# Patient Record
Sex: Female | Born: 1975 | Race: White | Hispanic: No | Marital: Single | Smoking: Former smoker
Health system: Southern US, Community
[De-identification: ages and names within clinical notes are randomized; demographics above are authoritative.]

---

## 2013-09-26 ENCOUNTER — Ambulatory Visit: Payer: Self-pay | Admitting: Physician Assistant

## 2013-09-26 LAB — URINALYSIS, COMPLETE
Bilirubin,UR: NEGATIVE
Glucose,UR: NEGATIVE mg/dL (ref 0–75)
Ketone: NEGATIVE
NITRITE: NEGATIVE
PH: 7.5 (ref 4.5–8.0)
Protein: 100
Specific Gravity: 1.01 (ref 1.003–1.030)

## 2013-09-28 LAB — URINE CULTURE

## 2014-02-03 ENCOUNTER — Ambulatory Visit: Payer: Self-pay | Admitting: Nurse Practitioner

## 2015-07-23 IMAGING — US US BREAST*L* LIMITED INC AXILLA
1 series · 4 of 4 positions shown · non-contrast
Comparison: Baseline exam

CLINICAL DATA: 38-year-old female with palpable left breast
finding.

EXAM:
DIGITAL DIAGNOSTIC  BILATERAL MAMMOGRAM WITH CAD
ULTRASOUND LEFT BREAST

[Series 1: us breast*left* limited inc axilla · 0.09mm/px · 4 of 4 slices shown]
[im 1/4]
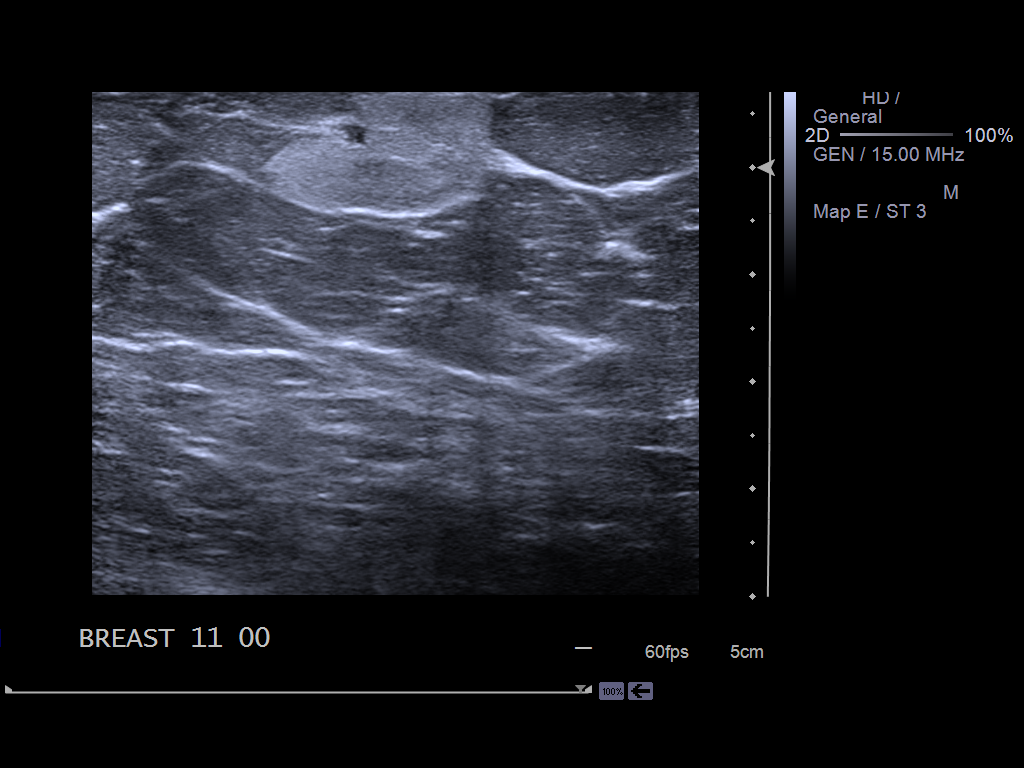
[im 2/4]
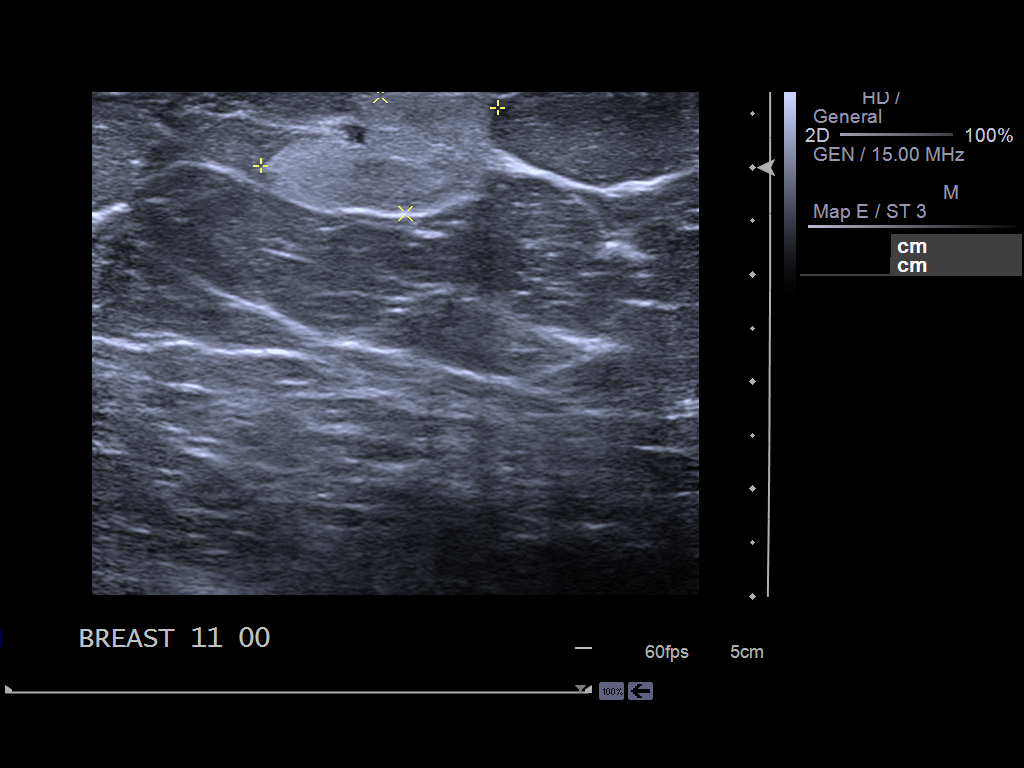
[im 3/4]
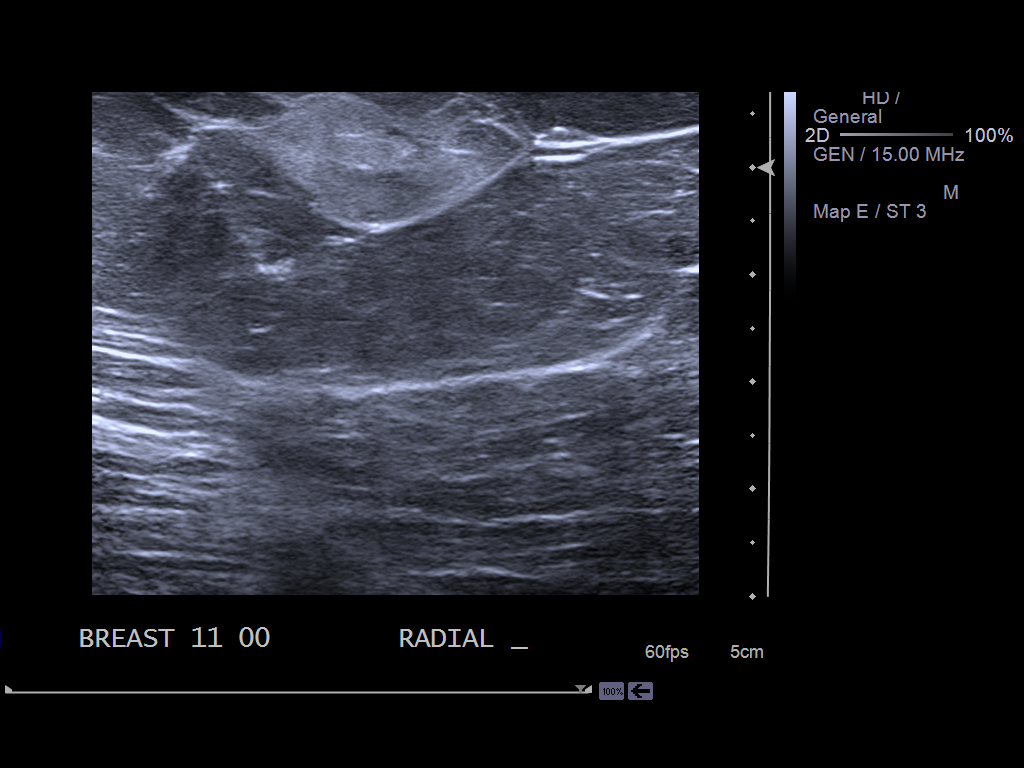
[im 4/4]
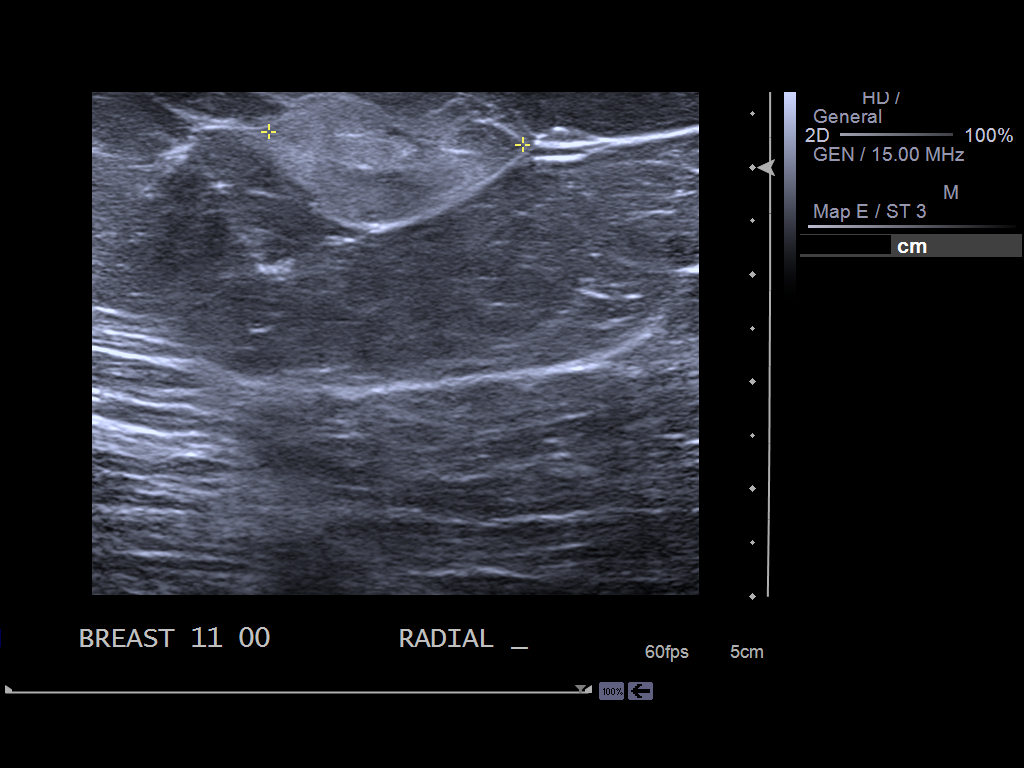

[4 of 4 positions shown; findings below may reference images not displayed]

ACR Breast Density Category b: There are scattered areas of
fibroglandular density.
FINDINGS: A mixed density focal asymmetry is identified within the upper,
inner left breast corresponding to the site of palpable marker. No
finding suggestive of malignancy is identified within either breast.

Mammographic images were processed with CAD.

On physical exam, the patient has an area of bruising near the site
of palpable abnormality within the left breast. A firm 2-3 cm mass
is identified within the left breast at 11 o'clock, 13 cm from the
nipple.

Targeted ultrasound was performed demonstrating a mixed
echogenicity, predominantly hyperechoic oval mass at 11 o'clock, 13
cm from the nipple measuring 2.4 x 2.3 x 1.1 cm. This finding most
likely represents fat necrosis.
IMPRESSION: Probably benign left breast finding.

RECOMMENDATION:
Follow-up left breast ultrasound and possible mammogram in 2 months.

I have discussed the findings and recommendations with the patient.
Results were also provided in writing at the conclusion of the
visit. If applicable, a reminder letter will be sent to the patient
regarding the next appointment.

BI-RADS CATEGORY  3: Probably benign.

## 2016-01-07 ENCOUNTER — Ambulatory Visit
Admission: EM | Admit: 2016-01-07 | Discharge: 2016-01-07 | Disposition: A | Discharge status: Home / Self Care | Payer: Self-pay | Attending: Family Medicine | Admitting: Family Medicine

## 2016-01-07 DIAGNOSIS — J069 Acute upper respiratory infection, unspecified: Secondary | ICD-10-CM

## 2016-01-07 MED ORDER — ALBUTEROL SULFATE HFA 108 (90 BASE) MCG/ACT IN AERS: 1.0000 | INHALATION_SPRAY | Freq: Four times a day (QID) | RESPIRATORY_TRACT | 0 refills | Status: DC | PRN

## 2016-01-07 MED ORDER — AZITHROMYCIN 250 MG PO TABS: ORAL_TABLET | ORAL | 0 refills | Status: DC

## 2016-01-07 NOTE — ED Triage Notes (Signed)
Patient complains of chest congestion, she started out with cold like symptoms (cough, runny nose) and over night it has settled in her chest and making it hard to breath.

## 2016-01-07 NOTE — ED Provider Notes (Signed)
[facility]    CSN: [id number] Arrival date & time: 01/07/16  7829  First Provider Contact:  None       History   Chief Complaint Chief Complaint  Patient presents with  . Nasal Congestion    Chest    HPI Mackenzie Griffin is a 40 y.o. female.   HPI: Patient presents today with symptoms of nasal congestion, chest congestion. Patient states that she's had the symptoms for the last 4-5 days. She states that initially she felt like she had a fever but no longer. She has had some wheezing. She denies any history of asthma or COPD. She does smoke. She denies any lower extremity edema or pain. She has been trying Mucinex for her symptoms.  History reviewed. No pertinent past medical history.  There are no active problems to display for this patient.   History reviewed. No pertinent surgical history.  OB History    Gravida Para Term Preterm AB Living   1             SAB TAB Ectopic Multiple Live Births                   Home Medications    Prior to Admission medications   Medication Sig Start Date End Date Taking? Authorizing Provider  albuterol (PROVENTIL HFA;VENTOLIN HFA) 108 (90 Base) MCG/ACT inhaler Inhale 1-2 puffs into the lungs every 6 (six) hours as needed for wheezing or shortness of breath. 01/07/16   Jolene Provost, MD  azithromycin (ZITHROMAX Z-PAK) 250 MG tablet Use as directed for 5 days on box. 01/07/16   Jolene Provost, MD    Family History Family History  Problem Relation Age of Onset  . Heart failure Father     Social History Social History  Substance Use Topics  . Smoking status: Current Every Day Smoker    Packs/day: 1.00    Types: Cigarettes  . Smokeless tobacco: Never Used  . Alcohol use No     Allergies   Review of patient's allergies indicates no known allergies.   Review of Systems Review of Systems: Negative except mentioned above.   Physical Exam Triage Vital Signs ED Triage Vitals  Enc Vitals Group     BP 01/07/16  0957 134/83     Pulse Rate 01/07/16 0957 95     Resp 01/07/16 0957 20     Temp 01/07/16 0957 97.9 F (36.6 C)     Temp Source 01/07/16 0957 Oral     SpO2 01/07/16 0957 96 %     Weight 01/07/16 0957 274 lb 9.6 oz (124.6 kg)     Height 01/07/16 0957 5\' 6"  (1.676 m)     Head Circumference --      Peak Flow --      Pain Score 01/07/16 0959 2     Pain Loc --      Pain Edu? --      Excl. in GC? --    No data found.   Updated Vital Signs BP 134/83 (BP Location: Right Arm)   Pulse 95   Temp 97.9 F (36.6 C) (Oral)   Resp 20   Ht 5\' 6"  (1.676 m)   Wt 274 lb 9.6 oz (124.6 kg)   LMP 12/15/2015   SpO2 96%   BMI 44.32 kg/m      Physical Exam:  GENERAL: NAD HEENT: mild pharyngeal erythema, no exudate, no erythema of TMs, no cervical LAD RESP: Mild expiratory wheezing, no  tachypnea or accessory muscle use CARD: RRR NEURO: CN II-XII grossly intact    UC Treatments / Results  Labs (all labs ordered are listed, but only abnormal results are displayed) Labs Reviewed - No data to display  EKG  EKG Interpretation None       Radiology No results found.  Procedures Procedures (including critical care time)  Medications Ordered in UC Medications - No data to display   Initial Impression / Assessment and Plan / UC Course  I have reviewed the triage vital signs and the nursing notes.  Pertinent labs & imaging results that were available during my care of the patient were reviewed by me and considered in my medical decision making (see chart for details).  Clinical Course   A/P: URI, Bronchitis - will treat with Z-Pak, albuterol inhaler when necessary, Delsym when necessary, rest, hydration, work excuse given to patient for today, I have advised the patient that if her symptoms do persist or worsen that she seek medical attention. Encourage patient on smoking cessation. Final Clinical Impressions(s) / UC Diagnoses   Final diagnoses:  URI, acute    New  Prescriptions New Prescriptions   ALBUTEROL (PROVENTIL HFA;VENTOLIN HFA) 108 (90 BASE) MCG/ACT INHALER    Inhale 1-2 puffs into the lungs every 6 (six) hours as needed for wheezing or shortness of breath.   AZITHROMYCIN (ZITHROMAX Z-PAK) 250 MG TABLET    Use as directed for 5 days on box.      Jolene Provost, MD 01/07/16 1023

## 2020-12-06 ENCOUNTER — Other Ambulatory Visit: Payer: Self-pay

## 2020-12-06 ENCOUNTER — Emergency Department: Payer: Self-pay

## 2020-12-06 ENCOUNTER — Inpatient Hospital Stay
Admission: EM | Admit: 2020-12-06 | Discharge: 2020-12-10 | DRG: 059 | Disposition: A | Discharge status: Home / Self Care | Payer: Managed Care, Other (non HMO) | Attending: Internal Medicine | Admitting: Internal Medicine

## 2020-12-06 DIAGNOSIS — Z6841 Body Mass Index (BMI) 40.0 and over, adult: Secondary | ICD-10-CM

## 2020-12-06 DIAGNOSIS — R509 Fever, unspecified: Secondary | ICD-10-CM | POA: Diagnosis present

## 2020-12-06 DIAGNOSIS — Z8249 Family history of ischemic heart disease and other diseases of the circulatory system: Secondary | ICD-10-CM

## 2020-12-06 DIAGNOSIS — F17210 Nicotine dependence, cigarettes, uncomplicated: Secondary | ICD-10-CM | POA: Diagnosis present

## 2020-12-06 DIAGNOSIS — E538 Deficiency of other specified B group vitamins: Secondary | ICD-10-CM | POA: Diagnosis present

## 2020-12-06 DIAGNOSIS — Z20822 Contact with and (suspected) exposure to covid-19: Secondary | ICD-10-CM | POA: Diagnosis present

## 2020-12-06 DIAGNOSIS — M2578 Osteophyte, vertebrae: Secondary | ICD-10-CM | POA: Diagnosis present

## 2020-12-06 DIAGNOSIS — T380X5A Adverse effect of glucocorticoids and synthetic analogues, initial encounter: Secondary | ICD-10-CM | POA: Diagnosis present

## 2020-12-06 DIAGNOSIS — H5461 Unqualified visual loss, right eye, normal vision left eye: Secondary | ICD-10-CM | POA: Diagnosis present

## 2020-12-06 DIAGNOSIS — G35 Multiple sclerosis: Principal | ICD-10-CM | POA: Diagnosis present

## 2020-12-06 DIAGNOSIS — E559 Vitamin D deficiency, unspecified: Secondary | ICD-10-CM | POA: Diagnosis present

## 2020-12-06 DIAGNOSIS — H469 Unspecified optic neuritis: Secondary | ICD-10-CM

## 2020-12-06 DIAGNOSIS — E669 Obesity, unspecified: Secondary | ICD-10-CM | POA: Diagnosis present

## 2020-12-06 DIAGNOSIS — F17200 Nicotine dependence, unspecified, uncomplicated: Secondary | ICD-10-CM | POA: Diagnosis present

## 2020-12-06 LAB — RESP PANEL BY RT-PCR (FLU A&B, COVID) ARPGX2
Influenza A by PCR: NEGATIVE
Influenza B by PCR: NEGATIVE
SARS Coronavirus 2 by RT PCR: NEGATIVE

## 2020-12-06 LAB — CBC
HCT: 43.1 % (ref 36.0–46.0)
Hemoglobin: 14.7 g/dL (ref 12.0–15.0)
MCH: 30.9 pg (ref 26.0–34.0)
MCHC: 34.1 g/dL (ref 30.0–36.0)
MCV: 90.5 fL (ref 80.0–100.0)
Platelets: 360 10*3/uL (ref 150–400)
RBC: 4.76 MIL/uL (ref 3.87–5.11)
RDW: 12.3 % (ref 11.5–15.5)
WBC: 9.8 10*3/uL (ref 4.0–10.5)
nRBC: 0 % (ref 0.0–0.2)

## 2020-12-06 LAB — BASIC METABOLIC PANEL
Anion gap: 8 (ref 5–15)
BUN: 7 mg/dL (ref 6–20)
CO2: 23 mmol/L (ref 22–32)
Calcium: 8.5 mg/dL — ABNORMAL LOW (ref 8.9–10.3)
Chloride: 107 mmol/L (ref 98–111)
Creatinine, Ser: 0.75 mg/dL (ref 0.44–1.00)
GFR, Estimated: >60 mL/min (ref >60)
Glucose, Bld: 141 mg/dL — ABNORMAL HIGH (ref 70–99)
Potassium: 3.6 mmol/L (ref 3.5–5.1)
Sodium: 138 mmol/L (ref 135–145)

## 2020-12-06 MED ORDER — ACETAMINOPHEN 325 MG PO TABS
650.0000 mg | ORAL_TABLET | Freq: Four times a day (QID) | ORAL | Status: DC | PRN
  Administered 2020-12-07 – 2020-12-09 (×3): 650 mg via ORAL
  Filled 2020-12-06 (×3): qty 2

## 2020-12-06 MED ORDER — ENOXAPARIN SODIUM 80 MG/0.8ML IJ SOSY
65.0000 mg | PREFILLED_SYRINGE | INTRAMUSCULAR | Status: DC
  Administered 2020-12-06 – 2020-12-09 (×4): 65 mg via SUBCUTANEOUS
  Filled 2020-12-06 (×5): qty 0.65

## 2020-12-06 MED ORDER — ACETAMINOPHEN 650 MG RE SUPP: 650.0000 mg | Freq: Four times a day (QID) | RECTAL | Status: DC | PRN

## 2020-12-06 MED ORDER — ONDANSETRON HCL 4 MG/2ML IJ SOLN: 4.0000 mg | Freq: Four times a day (QID) | INTRAMUSCULAR | Status: DC | PRN

## 2020-12-06 MED ORDER — SODIUM CHLORIDE 0.9 % IV SOLN: 250.0000 mL | INTRAVENOUS | Status: DC | PRN

## 2020-12-06 MED ORDER — SODIUM CHLORIDE 0.9% FLUSH: 3.0000 mL | INTRAVENOUS | Status: DC | PRN

## 2020-12-06 MED ORDER — ONDANSETRON HCL 4 MG PO TABS: 4.0000 mg | ORAL_TABLET | Freq: Four times a day (QID) | ORAL | Status: DC | PRN

## 2020-12-06 MED ORDER — SODIUM CHLORIDE 0.9 % IV SOLN
1000.0000 mg | Freq: Every day | INTRAVENOUS | Status: AC
  Administered 2020-12-06 – 2020-12-10 (×5): 1000 mg via INTRAVENOUS
  Filled 2020-12-06 (×5): qty 8

## 2020-12-06 MED ORDER — SODIUM CHLORIDE 0.9% FLUSH
3.0000 mL | Freq: Two times a day (BID) | INTRAVENOUS | Status: DC
  Administered 2020-12-06 – 2020-12-10 (×8): 3 mL via INTRAVENOUS

## 2020-12-06 MED ORDER — NICOTINE 21 MG/24HR TD PT24
21.0000 mg | MEDICATED_PATCH | Freq: Every day | TRANSDERMAL | Status: DC
  Administered 2020-12-06 – 2020-12-10 (×5): 21 mg via TRANSDERMAL
  Filled 2020-12-06 (×5): qty 1

## 2020-12-06 NOTE — H&P (Addendum)
History and Physical    Mackenzie Griffin:[id number] DOB: 1976/04/15 DOA: 12/06/2020  PCP: Pcp, No   Patient coming from: Home  I have personally briefly reviewed patient's old medical records in [facility]  Chief Complaint: Loss of vision in the right eye  HPI: Mackenzie Griffin is a 45 y.o. female with medical history significant for morbid obesity, nicotine dependence who presents to the emergency room at the request of her ophthalmologist for evaluation of visual loss involving the right eye as well as pain in her right eye with movement. Patient states she has had symptoms for about 10 days, she describes haziness in her right eye which has been persistent for over a week associated with pain with any form of eye movement.  She initially thought her symptoms will resolve but when it did not she made an appointment to see an ophthalmologist who ordered an MRI.  MRI of the brain was consistent with optic neuritis and hyperintensities concerning for a demyelinating process and so patient was sent to the emergency room for neurology consult. She has no focal deficits, no numbness, no tingling, no difficulty swallowing, no urinary or fecal incontinence, no chest pain, no shortness of breath, no dizziness, no lightheadedness, no fever, no chills, no cough, no headache. Labs show sodium 138, potassium 3.6, chloride 107, bicarb 23, glucose 141, BUN 7, creatinine 0.75, calcium 8.5, white count 9.8, hemoglobin 14.7, hematocrit 43.1, MCV 90.5, RDW 12.3, platelet count 360.   ED Course: Patient is a 45 year old female who was referred to the emergency room by her ophthalmologist for a neurology consult due to findings on MRI suggestive of optic neuritis involving the right eye and hyperintensities consistent with a demyelinating process. Patient gives a 10-day history of decreased visual acuity in her right eye associated with pain in her right eye with any form of eye movement. Neurology recommends  Solu-Medrol 1 g IV daily for 5 days. Patient will be admitted to the hospital for further evaluation.    Review of Systems: As per HPI otherwise all other systems reviewed and negative.    History reviewed. No pertinent past medical history.  History reviewed. No pertinent surgical history.   reports that she has been smoking cigarettes. She has been smoking an average of 1.00 packs per day. She has never used smokeless tobacco. She reports that she does not drink alcohol and does not use drugs.  No Known Allergies  Family History  Problem Relation Age of Onset   Coronary artery disease Father       Prior to Admission medications   Not on File    Physical Exam: Vitals:   12/06/20 1201 12/06/20 1205 12/06/20 1325  BP:  (!) 152/89 (!) 147/93  Pulse:  (!) 113 (!) 101  Resp:  18 16  Temp:  98.3 F (36.8 C)   TempSrc:  Oral   SpO2:  96% 96%  Height: 5\' 6"  (1.676 m)       Vitals:   12/06/20 1201 12/06/20 1205 12/06/20 1325  BP:  (!) 152/89 (!) 147/93  Pulse:  (!) 113 (!) 101  Resp:  18 16  Temp:  98.3 F (36.8 C)   TempSrc:  Oral   SpO2:  96% 96%  Height: 5\' 6"  (1.676 m)        Constitutional: Alert and oriented x 3 . Not in any apparent distress.  Obese HEENT:      Head: Normocephalic and atraumatic.  Eyes: PERLA, EOMI, Conjunctivae are normal. Sclera is non-icteric.       Mouth/Throat: Mucous membranes are moist.       Neck: Supple with no signs of meningismus. Cardiovascular: Regular rate and rhythm. No murmurs, gallops, or rubs. 2+ symmetrical distal pulses are present . No JVD. No LE edema Respiratory: Respiratory effort normal .Lungs sounds clear bilaterally. No wheezes, crackles, or rhonchi.  Gastrointestinal: Soft, non tender, and non distended with positive bowel sounds.  Genitourinary: No CVA tenderness. Musculoskeletal: Nontender with normal range of motion in all extremities. No cyanosis, or erythema of extremities. Neurologic:  Face is  symmetric. Moving all extremities. No gross focal neurologic deficits . Skin: Skin is warm, dry.  No rash or ulcers Psychiatric: Mood and affect are normal    Labs on Admission: I have personally reviewed following labs and imaging studies  CBC: Recent Labs  Lab 12/06/20 1217  WBC 9.8  HGB 14.7  HCT 43.1  MCV 90.5  PLT 360   Basic Metabolic Panel: Recent Labs  Lab 12/06/20 1217  NA 138  K 3.6  CL 107  CO2 23  GLUCOSE 141*  BUN 7  CREATININE 0.75  CALCIUM 8.5*   GFR: CrCl cannot be calculated (Unknown ideal weight.). Liver Function Tests: No results for input(s): AST, ALT, ALKPHOS, BILITOT, PROT, ALBUMIN in the last 168 hours. No results for input(s): LIPASE, AMYLASE in the last 168 hours. No results for input(s): AMMONIA in the last 168 hours. Coagulation Profile: No results for input(s): INR, PROTIME in the last 168 hours. Cardiac Enzymes: No results for input(s): CKTOTAL, CKMB, CKMBINDEX, TROPONINI in the last 168 hours. BNP (last 3 results) No results for input(s): PROBNP in the last 8760 hours. HbA1C: No results for input(s): HGBA1C in the last 72 hours. CBG: No results for input(s): GLUCAP in the last 168 hours. Lipid Profile: No results for input(s): CHOL, HDL, LDLCALC, TRIG, CHOLHDL, LDLDIRECT in the last 72 hours. Thyroid Function Tests: No results for input(s): TSH, T4TOTAL, FREET4, T3FREE, THYROIDAB in the last 72 hours. Anemia Panel: No results for input(s): VITAMINB12, FOLATE, FERRITIN, TIBC, IRON, RETICCTPCT in the last 72 hours. Urine analysis:    Component Value Date/Time   COLORURINE YELLOW 09/26/2013 1007   APPEARANCEUR CLOUDY 09/26/2013 1007   LABSPEC 1.010 09/26/2013 1007   PHURINE 7.5 09/26/2013 1007   GLUCOSEU NEGATIVE 09/26/2013 1007   HGBUR 2+ 09/26/2013 1007   BILIRUBINUR NEGATIVE 09/26/2013 1007   KETONESUR NEGATIVE 09/26/2013 1007   PROTEINUR 100 mg/dL 51/70/0174 9449   NITRITE NEGATIVE 09/26/2013 1007   LEUKOCYTESUR 3+  09/26/2013 1007    Radiological Exams on Admission: No results found.   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 Patient presents for evaluation of decreased visual acuity and pain in her right eye for about 10 days and had an MRI which showed findings consistent with optic neuritis and hyperintensities suggestive of a demyelinating lesion. Appreciate neurology input Continue Solu-Medrol 1 g IV daily as initiated in the ER Further recommendation per neurology    Nicotine dependence Smoking cessation has been discussed with patient in detail Will place patient on a nicotine transdermal patch at this time    Obesity Complicates overall prognosis and care  DVT prophylaxis: Lovenox  Code Status: full code  Family Communication: Greater than 50% of time was spent discussing patient's condition  and plan of care with her and her mother at the bedside.  All questions and concerns have been addressed.  They verbalized understanding and agree with the plan. Disposition Plan: Back to previous home environment Consults called: Neurology Status: At the time of admission, it appears that the appropriate admission status for this patient is inpatient. This is judged to be reasonable and necessary in order to provide the required intensity of service to ensure the patient's safety given the presenting symptoms, physical exam findings, and initial radiographic and laboratory data in the context of their comorbid conditions. Patient requires inpatient status due to high intensity of service, high risk of further deterioration and high frequency of surveillance required.    Lucile Shutters MD [facility]     12/06/2020, 2:52 PM

## 2020-12-06 NOTE — ED Notes (Signed)
Visual acuity: LEFT eye:20/30, RIGHT 20/50

## 2020-12-06 NOTE — ED Notes (Signed)
Informed RN bed assigned 

## 2020-12-06 NOTE — ED Triage Notes (Signed)
Pt was sent from [facility] for a neuro consult, pt has loss of vision in the right eye with some pain for the past 10 days, has had a MRI performed that shows some lesions.

## 2020-12-06 NOTE — Progress Notes (Signed)
PHARMACIST - PHYSICIAN COMMUNICATION  CONCERNING:  Enoxaparin (Lovenox) for DVT Prophylaxis    RECOMMENDATION: Patient was prescribed enoxaprin 40mg  q24 hours for VTE prophylaxis.   Filed Weights   12/06/20 1615  Weight: 129.3 kg (285 lb 1.6 oz)    Body mass index is 46.02 kg/m.  Estimated Creatinine Clearance: 123.7 mL/min (by C-G formula based on SCr of 0.75 mg/dL).   Based on [facility] policy patient is candidate for enoxaparin 0.5mg /kg TBW SQ every 24 hours based on BMI being >30.   DESCRIPTION: Pharmacy has adjusted enoxaparin dose per [facility] policy.  Patient is now receiving enoxaparin 65 mg every 24 hours    [facility], PharmD, BCPS Clinical Pharmacist  12/06/2020 4:23 PM

## 2020-12-06 NOTE — Plan of Care (Addendum)
Neurology Plan of Care  Patient presented to ophtho for evaluation or R eye decreased visual acuity and R eye pain x10 days. MRI brain wwo was performed and c/w optic neuritis (OSH images uploaded and personally reviewed, contrast enhancement noted of the R optic nerve with multiple small bilateral L>R periventricular ovoid T2/FLAIR hyperintensities that do not enhance). Patient referred to ED for admission by ophtho.   D/w Dr. Roxan Hockey ED by phone. Recommend:  - Admission to hospital service for tx R optic neuritis - 1g solumedrol IV daily x5 days - MRI c spine wwo (ordered) - I will see patient in formal consultation tomorrow  Bing Neighbors, MD [facility] [phone]  If 7pm- 7am, please page neurology on call as listed in AMION.  Addendum: Pt would like to defer MRI c spine 2/2 cost concerns. Will cancel order for now and discuss with her in AM.

## 2020-12-06 NOTE — ED Provider Notes (Signed)
[facility] Emergency Department Provider Note    Event Date/Time   First MD Initiated Contact with Patient 12/06/20 1248     (approximate)  I have reviewed the triage vital signs and the nursing notes.   HISTORY  Chief Complaint Loss of Vision    HPI Mackenzie Griffin is a 45 y.o. female with no significant past medical history presents to the ER for roughly 10 days of right-sided eye discomfort as well as visual loss.  States that the right eyes become cloudy.  Is never had symptoms like this before.  She went to see her eye doctor had reassuring exam which point MRI was ordered.  She was sent to the ER as the MRI shows evidence of demyelinating process concern for MS.  No family history.  No new medications.  No recent illnesses or fever.  History reviewed. No pertinent past medical history. Family History  Problem Relation Age of Onset   Heart failure Father    History reviewed. No pertinent surgical history. There are no problems to display for this patient.     Prior to Admission medications   Not on File    Allergies Patient has no known allergies.    Social History Social History   Tobacco Use   Smoking status: Every Day    Packs/day: 1.00    Pack years: 0.00    Types: Cigarettes   Smokeless tobacco: Never  Substance Use Topics   Alcohol use: No    Review of Systems Patient denies headaches, rhinorrhea, blurry vision, numbness, shortness of breath, chest pain, edema, cough, abdominal pain, nausea, vomiting, diarrhea, dysuria, fevers, rashes or hallucinations unless otherwise stated above in HPI. ____________________________________________   PHYSICAL EXAM:  VITAL SIGNS: Vitals:   12/06/20 1205 12/06/20 1325  BP: (!) 152/89 (!) 147/93  Pulse: (!) 113 (!) 101  Resp: 18 16  Temp: 98.3 F (36.8 C)   SpO2: 96% 96%    Constitutional: Alert and oriented.  Eyes: Conjunctivae are normal.  Head: Atraumatic. Nose: No  congestion/rhinnorhea. Mouth/Throat: Mucous membranes are moist.   Neck: No stridor. Painless ROM.  Cardiovascular: Normal rate, regular rhythm. Grossly normal heart sounds.  Good peripheral circulation. Respiratory: Normal respiratory effort.  No retractions. Lungs CTAB. Gastrointestinal: Soft and nontender. No distention. No abdominal bruits. No CVA tenderness. Genitourinary:  Musculoskeletal: No lower extremity tenderness nor edema.  No joint effusions. Neurologic:  CN- intact.  No facial droop, Normal FNF.  Normal heel to shin.  Sensation intact bilaterally. Normal speech and language. No gross focal neurologic deficits are appreciated. No gait instability. Skin:  Skin is warm, dry and intact. No rash noted. Psychiatric: Mood and affect are normal. Speech and behavior are normal.  ____________________________________________   LABS (all labs ordered are listed, but only abnormal results are displayed)  Results for orders placed or performed during the hospital encounter of 12/06/20 (from the past 24 hour(s))  CBC     Status: None   Collection Time: 12/06/20 12:17 PM  Result Value Ref Range   WBC 9.8 4.0 - 10.5 K/uL   RBC 4.76 3.87 - 5.11 MIL/uL   Hemoglobin 14.7 12.0 - 15.0 g/dL   HCT 11.9 14.7 - 82.9 %   MCV 90.5 80.0 - 100.0 fL   MCH 30.9 26.0 - 34.0 pg   MCHC 34.1 30.0 - 36.0 g/dL   RDW 56.2 13.0 - 86.5 %   Platelets 360 150 - 400 K/uL   nRBC 0.0 0.0 -  0.2 %  Basic metabolic panel     Status: Abnormal   Collection Time: 12/06/20 12:17 PM  Result Value Ref Range   Sodium 138 135 - 145 mmol/L   Potassium 3.6 3.5 - 5.1 mmol/L   Chloride 107 98 - 111 mmol/L   CO2 23 22 - 32 mmol/L   Glucose, Bld 141 (H) 70 - 99 mg/dL   BUN 7 6 - 20 mg/dL   Creatinine, Ser 1.61 0.44 - 1.00 mg/dL   Calcium 8.5 (L) 8.9 - 10.3 mg/dL   GFR, Estimated >09 >60 mL/min   Anion gap 8 5 - 15    ____________________________________________  EKG____________________________________________  RADIOLOGY   ____________________________________________   PROCEDURES  Procedure(s) performed:  Procedures    Critical Care performed: no ____________________________________________   INITIAL IMPRESSION / ASSESSMENT AND PLAN / ED COURSE  Pertinent labs & imaging results that were available during my care of the patient were reviewed by me and considered in my medical decision making (see chart for details).   DDX: optic neuritis, ms, stroke, mass  Mackenzie Griffin is a 45 y.o. who presents to the ED with findings and presentation clinically concerning for new diagnosis of MS.  Discussed case in consultation with Dr. Selina Cooley of neurology who was recommended 1 g of Solu-Medrol daily and will order additional MR testings as well as is recommended admission to hospital for further evaluation management.  Patient agreeable to plan.  Will discuss with hospitalist for admission.     The patient was evaluated in Emergency Department today for the symptoms described in the history of present illness. He/she was evaluated in the context of the global COVID-19 pandemic, which necessitated consideration that the patient might be at risk for infection with the SARS-CoV-2 virus that causes COVID-19. Institutional protocols and algorithms that pertain to the evaluation of patients at risk for COVID-19 are in a state of rapid change based on information released by regulatory bodies including the CDC and federal and state organizations. These policies and algorithms were followed during the patient's care in the ED.  As part of my medical decision making, I reviewed the following data within the electronic MEDICAL RECORD NUMBER Nursing notes reviewed and incorporated, Labs reviewed, notes from prior ED visits and [place] Controlled Substance Database   ____________________________________________   FINAL  CLINICAL IMPRESSION(S) / ED DIAGNOSES  Final diagnoses:  Optic neuritis      NEW MEDICATIONS STARTED DURING THIS VISIT:  New Prescriptions   No medications on file     Note:  This document was prepared using Dragon voice recognition software and may include unintentional dictation errors.    Willy Eddy, MD 12/06/20 [phone]

## 2020-12-07 ENCOUNTER — Inpatient Hospital Stay: Payer: Managed Care, Other (non HMO)

## 2020-12-07 DIAGNOSIS — G35 Multiple sclerosis: Principal | ICD-10-CM

## 2020-12-07 DIAGNOSIS — H469 Unspecified optic neuritis: Secondary | ICD-10-CM

## 2020-12-07 LAB — HIV ANTIBODY (ROUTINE TESTING W REFLEX): HIV Screen 4th Generation wRfx: NONREACTIVE

## 2020-12-07 MED ORDER — LORAZEPAM 1 MG PO TABS
1.0000 mg | ORAL_TABLET | Freq: Once | ORAL | Status: AC | PRN
  Administered 2020-12-07: 1 mg via ORAL
  Filled 2020-12-07: qty 1

## 2020-12-07 MED ORDER — GADOBUTROL 1 MMOL/ML IV SOLN
10.0000 mL | Freq: Once | INTRAVENOUS | Status: AC | PRN
  Administered 2020-12-07: 18:00:00 10 mL via INTRAVENOUS

## 2020-12-07 NOTE — Consult Note (Signed)
NEUROLOGY CONSULTATION NOTE   Date of service: December 07, 2020 Patient Name: Mackenzie Griffin MRN:  [id number] DOB:  January 25, 1976 Reason for consult: R optic neuritis Requesting physician: Anastasio Auerbach MD _ _ _   _ __   _ __ _ _  __ __   _ __   __ _  History of Present Illness   45 yo woman with hx tobacco abuse admitted for treatment of R optic neuritis. Patient presented to ophtho for evaluation or R eye decreased visual acuity and R eye pain x10 days. MRI brain wwo was performed and c/w optic neuritis (OSH images uploaded and personally reviewed, contrast enhancement noted of the R optic nerve with multiple small bilateral L>R periventricular ovoid T2/FLAIR hyperintensities that do not enhance). Patient referred to ED for admission by ophtho and admitted for 5 day course of IV solumedrol.  Patient has no personal or family history of MS or neurologic disorders. She has no present focal neurologic deficits other than her R visual disturbance and pain on R EOM. She has never had a time in the past where she developed permanent or transient neurologic deficits such as focal weakness or numbness, other visual disturbance, balance trouble, speech or gait impairment.   She has received 2 rounds of 1g solumedrol since admission yesterday. No change in visual acuity since that time. No new neurologic complaints today.    ROS   Per HPI; all other systems reviewed and are negative  Past History   History reviewed. No pertinent past medical history. History reviewed. No pertinent surgical history. Family History  Problem Relation Age of Onset   Coronary artery disease Father    Social History   Socioeconomic History   Marital status: Single    Spouse name: Not on file   Number of children: Not on file   Years of education: Not on file   Highest education level: Not on file  Occupational History   Not on file  Tobacco Use   Smoking status: Every Day    Packs/day: 1.00    Pack years: 0.00     Types: Cigarettes   Smokeless tobacco: Never  Substance and Sexual Activity   Alcohol use: No   Drug use: Never   Sexual activity: Not on file  Other Topics Concern   Not on file  Social History Narrative   Not on file   Social Determinants of Health   Financial Resource Strain: Not on file  Food Insecurity: Not on file  Transportation Needs: Not on file  Physical Activity: Not on file  Stress: Not on file  Social Connections: Not on file   No Known Allergies  Medications   No medications prior to admission.     Vitals   Vitals:   12/06/20 2345 12/07/20 0505 12/07/20 0807 12/07/20 1322  BP: (!) 147/83 138/82 124/88 140/87  Pulse: 82 83 83 98  Resp: 17 18 18 18   Temp: 97.7 F (36.5 C) 97.9 F (36.6 C) 98.1 F (36.7 C) 97.7 F (36.5 C)  TempSrc:    Oral  SpO2: 97% 100% 100% 100%  Weight:      Height:         Body mass index is 46.02 kg/m.  Physical Exam   Physical Exam Gen: A&O x4, NAD HEENT: Atraumatic, normocephalic;mucous membranes moist; oropharynx clear, tongue without atrophy or fasciculations. Neck: Supple, trachea midline. Resp: CTAB, no w/r/r CV: RRR, no m/g/r; nml S1 and S2. 2+ symmetric peripheral pulses. Abd:  soft/NT/ND; nabs x 4 quad Extrem: Nml bulk; no cyanosis, clubbing, or edema.  Neuro: *MS: A&O x4. Follows multi-step commands.  *Speech: fluid, nondysarthric, able to name and repeat *CN:    I: Deferred   II,III: PERRLA, R APD, VFF by confrontation   III,IV,VI: EOMI w/o nystagmus, no ptosis   V: Sensation intact from V1 to V3 to LT   VII: Eyelid closure was full.  Smile symmetric.   VIII: Hearing intact to voice   IX,X: Voice normal, palate elevates symmetrically    XI: SCM/trap 5/5 bilat   XII: Tongue protrudes midline, no atrophy or fasciculations   *Motor:   Normal bulk.  No tremor, rigidity or bradykinesia. No pronator drift.    Strength: Dlt Bic Tri WrE WrF FgS Gr HF KnF KnE PlF DoF    Left 5 5 5 5 5 5 5 5 5 5 5 5      Right 5 5 5 5 5 5 5 5 5 5 5 5     *Sensory: Intact to light touch, pinprick, temperature vibration throughout. Symmetric. Propioception intact bilat.  No double-simultaneous extinction.  *Coordination:  Finger-to-nose, heel-to-shin, rapid alternating motions were intact. *Reflexes:  2+ and symmetric throughout without clonus; toes down-going bilat *Gait: normal base, normal stride, normal turn. Negative Romberg.   Labs   CBC:  Recent Labs  Lab 12/06/20 1217  WBC 9.8  HGB 14.7  HCT 43.1  MCV 90.5  PLT 360    Basic Metabolic Panel:  Lab Results  Component Value Date   NA 138 12/06/2020   K 3.6 12/06/2020   CO2 23 12/06/2020   GLUCOSE 141 (H) 12/06/2020   BUN 7 12/06/2020   CREATININE 0.75 12/06/2020   CALCIUM 8.5 (L) 12/06/2020   GFRNONAA >60 12/06/2020   Lipid Panel: No results found for: LDLCALC HgbA1c: No results found for: HGBA1C Urine Drug Screen: No results found for: LABOPIA, COCAINSCRNUR, LABBENZ, AMPHETMU, THCU, LABBARB  Alcohol Level No results found for: ETH   Impression   45 yo woman with no personal hx MS presents with 10 days decreased R visual acuity and pain on R EOM. MRI brain wwo contrast significant for R optic nerve contrast enhancement suggesting R optic neuritis. She additionally has some older small bilateral L>R periventricular ovoid T2/FLAIR hyperintensities that may be supportive of MS dx although she has not been symptomatic from these in the past. Pt now amenable to MRI c spine for further clarification of dx.  Recommendations   Continue 1g daily IV solumedrol x5 days; end date 12/10/20 MRI c spine wwo  Will continue to follow ______________________________________________________________________   Thank you for the opportunity to take part in the care of this patient. If you have any further questions, please contact the neurology consultation attending.  Signed,  60, MD [facility] [phone]  If 7pm- 7am,  please page neurology on call as listed in AMION.

## 2020-12-07 NOTE — Progress Notes (Signed)
PROGRESS NOTE    Mackenzie Griffin  GEZ:[id number] DOB: 04-30-1976 DOA: 12/06/2020 PCP: Pcp, No   Chief Complaint  Patient presents with   Loss of Vision    Brief Narrative: 45 year old female with history of morbid obesity nicotine dependence presented to the ED on referral from ophthalmologist for evaluation of visual loss of the right eye as well as pain in the right eye with movement x10 days.   She had work-up by ophthalmology as outpatient MRI of the brain consistent with optic neuritis and possible demyelinating process. Neurology was consulted patient was placed on high-dose steroid and admitted for further management  Subjective: Seen and examined this morning.  Mother at the bedside. still complains of visual blurriness on the right eye.   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  Neurology consulted per recommendation continue Solu-Medrol 1 g daily # 2/5.  Neurology planning MRI C-spine-and patient has questions about the MRI.Monitor symptoms closely.  Nicotine dependence: Smoking cessation discussed on admission.  Continue nicotine patch.  Morbid obesity BMI 46:Will benefit with weight loss healthy lifestyle.    Diet Order             Diet regular Room service appropriate? Yes; Fluid consistency: Thin  Diet effective now                 Patient's Body mass index is 46.02 kg/m.  DVT prophylaxis:   Lovenox Code Status:   Code Status: Full Code  Family Communication: plan of care discussed with patient at bedside.  Status is: Inpatient  Remains inpatient appropriate because:IV treatments appropriate due to intensity of illness or inability to take PO and Inpatient level of care appropriate due to severity of illness  Dispo: The patient is from: Home              Anticipated d/c is to: Home              Patient currently is not medically stable to  d/c.   Difficult to place patient No  Unresulted Labs (From admission, onward)     Start     Ordered   12/13/20 0500  Creatinine, serum  (enoxaparin (LOVENOX)    CrCl >/= 30 ml/min)  Weekly,   STAT     Comments: while on enoxaparin therapy    12/06/20 1439   12/06/20 1437  HIV Antibody (routine testing w rflx)  (HIV Antibody (Routine testing w reflex) panel)  Once,   STAT        12/06/20 1439           Medications reviewed:  Scheduled Meds:  enoxaparin (LOVENOX) injection  65 mg Subcutaneous Q24H   nicotine  21 mg Transdermal Daily   sodium chloride flush  3 mL Intravenous Q12H   Continuous Infusions:  sodium chloride     methylPREDNISolone (SOLU-MEDROL) injection Stopped (12/06/20 2020)    Consultants:see note  Procedures:see note  Antimicrobials: Anti-infectives (From admission, onward)    None      Culture/Microbiology No results found for: SDES, SPECREQUEST, CULT, REPTSTATUS  Other culture-see note  Objective: Vitals: Today's Vitals   12/06/20 1915 12/06/20 2050 12/06/20 2345 12/07/20 0505  BP:  (!) 153/79 (!) 147/83 138/82  Pulse:  87 82 83  Resp:  18 17 18   Temp:  97.7 F (36.5 C) 97.7 F (36.5 C) 97.9 F (36.6 C)  TempSrc:  Oral    SpO2:  98% 97% 100%  Weight:      Height:      PainSc: 0-No pain  0-No pain Asleep    Intake/Output Summary (Last 24 hours) at 12/07/2020 0730 Last data filed at 12/06/2020 2020 Gross per 24 hour  Intake 56.13 ml  Output --  Net 56.13 ml   Filed Weights   12/06/20 1615  Weight: 129.3 kg   Weight change:   Intake/Output from previous day: 07/07 0701 - 07/08 0700 In: 56.1 [IV Piggyback:56.1] Out: -  Intake/Output this shift: No intake/output data recorded. Filed Weights   12/06/20 1615  Weight: 129.3 kg    Examination: General exam: AAOx3,older than stated age, weak appearing. HEENT:Oral mucosa moist, Ear/Nose WNL grossly,dentition normal. Respiratory system: bilaterally diminished,no crackles,no use  of accessory muscle, non tender. Cardiovascular system: S1 & S2 +,No JVD. Gastrointestinal system: Abdomen soft, NT,ND, BS+. Nervous System:Alert, awake, moving extremities.  Decreased vision on the right able to count fingers with rt eyes only when close Extremities: no edema, distal peripheral pulses palpable.  Skin: No rashes,no icterus. MSK: Normal muscle bulk,tone, power  Data Reviewed: I have personally reviewed following labs and imaging studies CBC: Recent Labs  Lab 12/06/20 1217  WBC 9.8  HGB 14.7  HCT 43.1  MCV 90.5  PLT 360   Basic Metabolic Panel: Recent Labs  Lab 12/06/20 1217  NA 138  K 3.6  CL 107  CO2 23  GLUCOSE 141*  BUN 7  CREATININE 0.75  CALCIUM 8.5*   GFR: Estimated Creatinine Clearance: 123.7 mL/min (by C-G formula based on SCr of 0.75 mg/dL). Liver Function Tests: No results for input(s): AST, ALT, ALKPHOS, BILITOT, PROT, ALBUMIN in the last 168 hours. No results for input(s): LIPASE, AMYLASE in the last 168 hours. No results for input(s): AMMONIA in the last 168 hours. Coagulation Profile: No results for input(s): INR, PROTIME in the last 168 hours. Cardiac Enzymes: No results for input(s): CKTOTAL, CKMB, CKMBINDEX, TROPONINI in the last 168 hours. BNP (last 3 results) No results for input(s): PROBNP in the last 8760 hours. HbA1C: No results for input(s): HGBA1C in the last 72 hours. CBG: No results for input(s): GLUCAP in the last 168 hours. Lipid Profile: No results for input(s): CHOL, HDL, LDLCALC, TRIG, CHOLHDL, LDLDIRECT in the last 72 hours. Thyroid Function Tests: No results for input(s): TSH, T4TOTAL, FREET4, T3FREE, THYROIDAB in the last 72 hours. Anemia Panel: No results for input(s): VITAMINB12, FOLATE, FERRITIN, TIBC, IRON, RETICCTPCT in the last 72 hours. Sepsis Labs: No results for input(s): PROCALCITON, LATICACIDVEN in the last 168 hours.  Recent Results (from the past 240 hour(s))  Resp Panel by RT-PCR (Flu A&B, Covid)  Nasopharyngeal Swab     Status: None   Collection Time: 12/06/20  2:05 PM   Specimen: Nasopharyngeal Swab; Nasopharyngeal(NP) swabs in vial transport medium  Result Value Ref Range Status   SARS Coronavirus 2 by RT PCR NEGATIVE NEGATIVE Final    Comment: (NOTE) SARS-CoV-2 target nucleic acids are NOT DETECTED.  The SARS-CoV-2 RNA is generally detectable in upper respiratory specimens during the acute phase of infection. The lowest concentration of SARS-CoV-2 viral copies this assay can detect is 138 copies/mL. A negative result does not preclude SARS-Cov-2 infection and should not be used as the sole basis for treatment or other patient management decisions. A negative result may occur with  improper specimen collection/handling, submission of specimen other than nasopharyngeal swab, presence of viral mutation(s) within the  areas targeted by this assay, and inadequate number of viral copies(<138 copies/mL). A negative result must be combined with clinical observations, patient history, and epidemiological information. The expected result is Negative.  Fact Sheet for Patients:  BloggerCourse.com  Fact Sheet for Healthcare Providers:  SeriousBroker.it  This test is no t yet approved or cleared by the [place] FDA and  has been authorized for detection and/or diagnosis of SARS-CoV-2 by FDA under an Emergency Use Authorization (EUA). This EUA will remain  in effect (meaning this test can be used) for the duration of the COVID-19 declaration under Section 564(b)(1) of the Act, 21 U.S.C.section 360bbb-3(b)(1), unless the authorization is terminated  or revoked sooner.       Influenza A by PCR NEGATIVE NEGATIVE Final   Influenza B by PCR NEGATIVE NEGATIVE Final    Comment: (NOTE) The Xpert Xpress SARS-CoV-2/FLU/RSV plus assay is intended as an aid in the diagnosis of influenza from Nasopharyngeal swab specimens and should not be  used as a sole basis for treatment. Nasal washings and aspirates are unacceptable for Xpert Xpress SARS-CoV-2/FLU/RSV testing.  Fact Sheet for Patients: BloggerCourse.com  Fact Sheet for Healthcare Providers: SeriousBroker.it  This test is not yet approved or cleared by the [place] FDA and has been authorized for detection and/or diagnosis of SARS-CoV-2 by FDA under an Emergency Use Authorization (EUA). This EUA will remain in effect (meaning this test can be used) for the duration of the COVID-19 declaration under Section 564(b)(1) of the Act, 21 U.S.C. section 360bbb-3(b)(1), unless the authorization is terminated or revoked.  Performed at [facility], [address], [place], [place] [address]   Radiology Studies: No results found.   LOS: 1 day   Lanae Boast, MD [facility]  12/07/2020, 7:30 AM

## 2020-12-08 LAB — CBC WITH DIFFERENTIAL/PLATELET
Abs Immature Granulocytes: 0.2 10*3/uL — ABNORMAL HIGH (ref 0.00–0.07)
Basophils Absolute: 0 10*3/uL (ref 0.0–0.1)
Basophils Relative: 0 %
Eosinophils Absolute: 0 10*3/uL (ref 0.0–0.5)
Eosinophils Relative: 0 %
HCT: 46.9 % — ABNORMAL HIGH (ref 36.0–46.0)
Hemoglobin: 15.7 g/dL — ABNORMAL HIGH (ref 12.0–15.0)
Immature Granulocytes: 1 %
Lymphocytes Relative: 4 %
Lymphs Abs: 0.7 10*3/uL (ref 0.7–4.0)
MCH: 30.9 pg (ref 26.0–34.0)
MCHC: 33.5 g/dL (ref 30.0–36.0)
MCV: 92.3 fL (ref 80.0–100.0)
Monocytes Absolute: 0.3 10*3/uL (ref 0.1–1.0)
Monocytes Relative: 2 %
Neutro Abs: 16.1 10*3/uL — ABNORMAL HIGH (ref 1.7–7.7)
Neutrophils Relative %: 93 %
Platelets: 386 10*3/uL (ref 150–400)
RBC: 5.08 MIL/uL (ref 3.87–5.11)
RDW: 12.3 % (ref 11.5–15.5)
WBC: 17.3 10*3/uL — ABNORMAL HIGH (ref 4.0–10.5)
nRBC: 0 % (ref 0.0–0.2)

## 2020-12-08 LAB — COMPREHENSIVE METABOLIC PANEL
ALT: 19 U/L (ref 0–44)
AST: 24 U/L (ref 15–41)
Albumin: 3.7 g/dL (ref 3.5–5.0)
Alkaline Phosphatase: 68 U/L (ref 38–126)
Anion gap: 6 (ref 5–15)
BUN: 15 mg/dL (ref 6–20)
CO2: 25 mmol/L (ref 22–32)
Calcium: 8.9 mg/dL (ref 8.9–10.3)
Chloride: 108 mmol/L (ref 98–111)
Creatinine, Ser: 0.96 mg/dL (ref 0.44–1.00)
GFR, Estimated: >60 mL/min (ref >60)
Glucose, Bld: 144 mg/dL — ABNORMAL HIGH (ref 70–99)
Potassium: 4.1 mmol/L (ref 3.5–5.1)
Sodium: 139 mmol/L (ref 135–145)
Total Bilirubin: 0.5 mg/dL (ref 0.3–1.2)
Total Protein: 7.6 g/dL (ref 6.5–8.1)

## 2020-12-08 LAB — VITAMIN B12: Vitamin B-12: 174 pg/mL — ABNORMAL LOW (ref 180–914)

## 2020-12-08 LAB — VITAMIN D 25 HYDROXY (VIT D DEFICIENCY, FRACTURES): Vit D, 25-Hydroxy: 8.17 ng/mL — ABNORMAL LOW (ref 30–100)

## 2020-12-08 NOTE — Progress Notes (Signed)
PROGRESS NOTE    Mackenzie Griffin  BUL:[id number] DOB: 10/16/1975 DOA: 12/06/2020 PCP: Pcp, No   Chief Complaint  Patient presents with   Loss of Vision    Brief Narrative: 45 year old female with history of morbid obesity nicotine dependence presented to the ED on referral from ophthalmologist for evaluation of visual loss of the right eye as well as pain in the right eye with movement x10 days.   She had work-up by ophthalmology as outpatient MRI of the brain consistent with optic neuritis and possible demyelinating process. Neurology was consulted patient was placed on high-dose steroid and admitted for further management.  Subjective:  Afebrile overnight Rt eye still hazy. No numbness and tingling or weakness    Assessment & Plan:  Optic neuritis due to multiple sclerosis Vision loss on the right eye New onset with abnormal MRI brain on outpatient evaluation by ophthalmology and sent to the ED for admission.Symptom onset 10 days PTA.  Patient will consult on board, continue Solu-Medrol 1 g daily Day # 3/5.  MRI C-spine with and without no evidence of diminutively disease  Nicotine dependence: Smoking cessation discussed on admission.  Continue nicotine patch.  Morbid obesity BMI 46: Patient will benefit with weight loss healthy lifestyle.    Diet Order             Diet regular Room service appropriate? Yes; Fluid consistency: Thin  Diet effective now                 Patient's Body mass index is 46.02 kg/m.  DVT prophylaxis:  SQ Lovenox Code Status:   Code Status: Full Code  Family Communication: plan of care discussed with patient at bedside.  Discussed with patient's mother at the bedside 7/8.  Status is: Inpatient Remains inpatient appropriate because:IV treatments appropriate due to intensity of illness or inability to take PO and Inpatient level of care appropriate due to severity of illness Dispo: The patient is from: Home              Anticipated d/c is to:  Home              Patient currently is not medically stable to d/c.   Difficult to place patient No  Unresulted Labs (From admission, onward)     Start     Ordered   12/13/20 0500  Creatinine, serum  (enoxaparin (LOVENOX)    CrCl >/= 30 ml/min)  Weekly,   STAT     Comments: while on enoxaparin therapy    12/06/20 1439           Medications reviewed:  Scheduled Meds:  enoxaparin (LOVENOX) injection  65 mg Subcutaneous Q24H   nicotine  21 mg Transdermal Daily   sodium chloride flush  3 mL Intravenous Q12H   Continuous Infusions:  sodium chloride     methylPREDNISolone (SOLU-MEDROL) injection 1,000 mg (12/07/20 0845)    Consultants:see note  Procedures:see note  Antimicrobials: Anti-infectives (From admission, onward)    None      Culture/Microbiology No results found for: SDES, SPECREQUEST, CULT, REPTSTATUS  Other culture-see note  Objective: Vitals: Today's Vitals   12/07/20 2030 12/07/20 2044 12/07/20 2354 12/08/20 0439  BP: 132/81  130/86 137/87  Pulse: 92  81 76  Resp: 16  18 18   Temp: 98 F (36.7 C)  (!) 97.5 F (36.4 C) 97.8 F (36.6 C)  TempSrc:   Oral Oral  SpO2: 93%  94% 98%  Weight:  Height:      PainSc:  Asleep 0-No pain 0-No pain    Intake/Output Summary (Last 24 hours) at 12/08/2020 0713 Last data filed at 12/07/2020 1013 Gross per 24 hour  Intake 0 ml  Output --  Net 0 ml    Filed Weights   12/06/20 1615  Weight: 129.3 kg   Weight change:   Intake/Output from previous day: No intake/output data recorded. Intake/Output this shift: No intake/output data recorded. Filed Weights   12/06/20 1615  Weight: 129.3 kg    Examination: General exam: AAOx3,  HEENT:Oral mucosa moist, Ear/Nose WNL grossly, dentition normal. Respiratory system: bilaterally diminished,no use of accessory muscle Cardiovascular system: S1 & S2 +, No JVD,. Gastrointestinal system: Abdomen soft,NT,ND, BS+ Nervous System:Alert, awake, moving extremities  and grossly nonfocal Extremities: no edema, distal peripheral pulses palpable.  Skin: No rashes,no icterus. MSK: Normal muscle bulk,tone, power  Data Reviewed: I have personally reviewed following labs and imaging studies CBC: Recent Labs  Lab 12/06/20 1217  WBC 9.8  HGB 14.7  HCT 43.1  MCV 90.5  PLT 360    Basic Metabolic Panel: Recent Labs  Lab 12/06/20 1217  NA 138  K 3.6  CL 107  CO2 23  GLUCOSE 141*  BUN 7  CREATININE 0.75  CALCIUM 8.5*    GFR: Estimated Creatinine Clearance: 123.7 mL/min (by C-G formula based on SCr of 0.75 mg/dL). Liver Function Tests: No results for input(s): AST, ALT, ALKPHOS, BILITOT, PROT, ALBUMIN in the last 168 hours. No results for input(s): LIPASE, AMYLASE in the last 168 hours. No results for input(s): AMMONIA in the last 168 hours. Coagulation Profile: No results for input(s): INR, PROTIME in the last 168 hours. Cardiac Enzymes: No results for input(s): CKTOTAL, CKMB, CKMBINDEX, TROPONINI in the last 168 hours. BNP (last 3 results) No results for input(s): PROBNP in the last 8760 hours. HbA1C: No results for input(s): HGBA1C in the last 72 hours. CBG: No results for input(s): GLUCAP in the last 168 hours. Lipid Profile: No results for input(s): CHOL, HDL, LDLCALC, TRIG, CHOLHDL, LDLDIRECT in the last 72 hours. Thyroid Function Tests: No results for input(s): TSH, T4TOTAL, FREET4, T3FREE, THYROIDAB in the last 72 hours. Anemia Panel: No results for input(s): VITAMINB12, FOLATE, FERRITIN, TIBC, IRON, RETICCTPCT in the last 72 hours. Sepsis Labs: No results for input(s): PROCALCITON, LATICACIDVEN in the last 168 hours.  Recent Results (from the past 240 hour(s))  Resp Panel by RT-PCR (Flu A&B, Covid) Nasopharyngeal Swab     Status: None   Collection Time: 12/06/20  2:05 PM   Specimen: Nasopharyngeal Swab; Nasopharyngeal(NP) swabs in vial transport medium  Result Value Ref Range Status   SARS Coronavirus 2 by RT PCR NEGATIVE  NEGATIVE Final    Comment: (NOTE) SARS-CoV-2 target nucleic acids are NOT DETECTED.  The SARS-CoV-2 RNA is generally detectable in upper respiratory specimens during the acute phase of infection. The lowest concentration of SARS-CoV-2 viral copies this assay can detect is 138 copies/mL. A negative result does not preclude SARS-Cov-2 infection and should not be used as the sole basis for treatment or other patient management decisions. A negative result may occur with  improper specimen collection/handling, submission of specimen other than nasopharyngeal swab, presence of viral mutation(s) within the areas targeted by this assay, and inadequate number of viral copies(<138 copies/mL). A negative result must be combined with clinical observations, patient history, and epidemiological information. The expected result is Negative.  Fact Sheet for Patients:  BloggerCourse.com  Fact Sheet for  Healthcare Providers:  SeriousBroker.it  This test is no t yet approved or cleared by the [place] and  has been authorized for detection and/or diagnosis of SARS-CoV-2 by FDA under an Emergency Use Authorization (EUA). This EUA will remain  in effect (meaning this test can be used) for the duration of the COVID-19 declaration under Section 564(b)(1) of the Act, 21 U.S.C.section 360bbb-3(b)(1), unless the authorization is terminated  or revoked sooner.       Influenza A by PCR NEGATIVE NEGATIVE Final   Influenza B by PCR NEGATIVE NEGATIVE Final    Comment: (NOTE) The Xpert Xpress SARS-CoV-2/FLU/RSV plus assay is intended as an aid in the diagnosis of influenza from Nasopharyngeal swab specimens and should not be used as a sole basis for treatment. Nasal washings and aspirates are unacceptable for Xpert Xpress SARS-CoV-2/FLU/RSV testing.  Fact Sheet for Patients: BloggerCourse.com  Fact Sheet for Healthcare  Providers: SeriousBroker.it  This test is not yet approved or cleared by the [place] FDA and has been authorized for detection and/or diagnosis of SARS-CoV-2 by FDA under an Emergency Use Authorization (EUA). This EUA will remain in effect (meaning this test can be used) for the duration of the COVID-19 declaration under Section 564(b)(1) of the Act, 21 U.S.C. section 360bbb-3(b)(1), unless the authorization is terminated or revoked.  Performed at [facility], [address] [place], [place] [address]    Radiology Studies: MR CERVICAL SPINE W WO CONTRAST  Result Date: 12/07/2020 CLINICAL DATA:  Initial evaluation for multiple sclerosis, baseline. EXAM: MRI CERVICAL SPINE WITHOUT AND WITH CONTRAST TECHNIQUE: Multiplanar and multiecho pulse sequences of the cervical spine, to include the craniocervical junction and cervicothoracic junction, were obtained without and with intravenous contrast. CONTRAST:  69mL GADAVIST GADOBUTROL 1 MMOL/ML IV SOLN COMPARISON:  Prior outside MRI from 12/06/2020. FINDINGS: Alignment: Straightening of the normal cervical lordosis. No listhesis. Vertebrae: Vertebral body height maintained without acute or chronic fracture. Bone marrow signal intensity within normal limits. Small benign hemangioma noted within the T1 vertebral body. No other discrete or worrisome osseous lesions. No abnormal marrow edema or enhancement. Cord: Signal intensity within the cervical spinal cord is within normal limits. No convincing cord signal abnormality to suggest demyelinating disease identified. No made of an apparent small focus of T2 signal intensity involving the ventral/central cord at the level of C7-T1 on sagittal T2 weighted sequence (series 5, image 8), not definitely seen on corresponding sequences, and favored to be artifactual. No abnormal enhancement. Normal cord caliber and morphology. Posterior Fossa, vertebral arteries, paraspinal  tissues: Minimal cerebellar tonsillar ectopia without frank Chiari malformation. Probable focus of T2 signal abnormality noted involving the dorsal pons/brainstem, likely related to multiple sclerosis as seen on prior brain MRI. Craniocervical junction within normal limits. Paraspinous and prevertebral soft tissues normal. Normal flow voids seen within the vertebral arteries bilaterally. Disc levels: C2-C3: Unremarkable. C3-C4:  Unremarkable. C4-C5: Mild uncovertebral hypertrophy without significant disc bulge. No spinal stenosis. Foramina remain patent. C5-C6: Broad-based posterior disc osteophyte complex flattens and partially effaces the ventral thecal sac, eccentric to the right. Mild spinal stenosis without frank cord impingement. Foramina remain patent. C6-C7: Small right paracentral disc protrusion indents the right ventral thecal sac (series 9, image 22). No significant spinal stenosis or cord deformity, although the ventral right C7 nerve root could be affected. Foramina remain patent. C7-T1:  Unremarkable. Visualized upper thoracic spine demonstrates no significant finding. IMPRESSION: 1. Normal MRI appearance of the cervical spinal cord, with no evidence for demyelinating  disease. No abnormal enhancement. 2. Small right paracentral disc protrusion at C6-7, potentially affecting the ventral right C7 nerve root. 3. Right eccentric disc osteophyte complex at C\5-6 with resultant mild spinal stenosis. Electronically Signed   By: Rise Mu M.D.   On: 12/07/2020 22:07     LOS: 2 days   Lanae Boast, MD [facility]  12/08/2020, 7:13 AM

## 2020-12-08 NOTE — Progress Notes (Signed)
Neurology Progress Note  Patient ID: 45 yo woman admitted for optic neuritis with nonenhancing lesions on MRI typical for MS.  Subjective: - No change in visual sx - No new neurologic complaints - Pt eager to go home but understands she needs to stay until 5 day solumedrol course completed on Mon  Interval data  MRI c spine wwo  1. Normal MRI appearance of the cervical spinal cord, with no evidence for demyelinating disease. No abnormal enhancement. 2. Small right paracentral disc protrusion at C6-7, potentially affecting the ventral right C7 nerve root. 3. Right eccentric disc osteophyte complex at C\5-6 with resultant mild spinal stenosis.  CNS imaging personally reviewed; I agree with above interpretation   Exam: Vitals:   12/08/20 0915 12/08/20 1701  BP: 137/81 (!) 153/93  Pulse: 81 78  Resp: 16 17  Temp: 97.6 F (36.4 C) 98.1 F (36.7 C)  SpO2: 100% 98%   Physical Exam Gen: A&O x4, NAD HEENT: Atraumatic, normocephalic;mucous membranes moist; oropharynx clear, tongue without atrophy or fasciculations. Neck: Supple, trachea midline. Resp: CTAB, no w/r/r CV: RRR, no m/g/r; nml S1 and S2. 2+ symmetric peripheral pulses. Abd: soft/NT/ND; nabs x 4 quad Extrem: Nml bulk; no cyanosis, clubbing, or edema.   Neuro: *MS: A&O x4. Follows multi-step commands. *Speech: fluid, nondysarthric, able to name and repeat *CN:   I: Deferred   II,III: PERRLA, R APD, VFF by confrontation   III,IV,VI: EOMI w/o nystagmus, no ptosis   V: Sensation intact from V1 to V3 to LT   VII: Eyelid closure was full.  Smile symmetric.   VIII: Hearing intact to voice   IX,X: Voice normal, palate elevates symmetrically   XI: SCM/trap 5/5 bilat   XII: Tongue protrudes midline, no atrophy or fasciculations   *Motor:   Normal bulk.  No tremor, rigidity or bradykinesia. No pronator drift.     Strength: Dlt Bic Tri WrE WrF FgS Gr HF KnF KnE PlF DoF   Left 5 5 5 5 5 5 5 5 5 5 5 5     Right 5 5 5 5 5 5 5 5 5 5 5 5      *Sensory: Intact to light touch, pinprick, temperature vibration throughout. Symmetric. Propioception intact bilat.  No double-simultaneous extinction. *Coordination:  Finger-to-nose, heel-to-shin, rapid alternating motions were intact. *Reflexes:  2+ and symmetric throughout without clonus; toes down-going bilat *Gait: deferred  Impression: 45 yo woman p/w 10 days R eye pain on movement and decreased visual acuity on R. Outside MRI brain uploaded and shows enhancement of R optic nerve + multiple small bilateral L>R nonenhancing ovoid periventricular lesions supportive of MS dx. No hx prior neurologic sx. Nonenhancing brain lesions are small but typical of MS. MRI c spine showed no cord abnl. On 1g solumedrol daily x5 days end date Mon 7/11. - labs: CMP, CBC w/ diff, vit D, B12, JC virus - continue 5 day course of 1g daily solumedrol, end date 7/11. Plan to d/c Mon after last solumedrol dose.  - Referral placed for outpatient neurology f/u at [facility]  Will continue to follow.  9/11, MD [facility] [phone]  If 7pm- 7am, please page neurology on call as listed in AMION.

## 2020-12-09 NOTE — Progress Notes (Signed)
PROGRESS NOTE    Mackenzie Griffin  JAS:[id number] DOB: 09-08-1975 DOA: 12/06/2020 PCP: Pcp, No   Chief Complaint  Patient presents with   Loss of Vision    Brief Narrative: 45 year old female with history of morbid obesity nicotine dependence presented to the ED on referral from ophthalmologist for evaluation of visual loss of the right eye as well as pain in the right eye with movement x10 days.   She had work-up by ophthalmology as outpatient MRI of the brain consistent with optic neuritis and possible demyelinating process. Neurology was consulted patient was placed on high-dose steroid and admitted for further management. Seen by neurology MRI C-spine no acute finding, plan for total 5 days of 1 g Solu-Medrol end date 7/11 after especially be discharged home with neurology follow-up as outpatient  Subjective: Seen and examined this morning No fever overnight. Reports vision on the right side is somewhat better today, no other new complaints   Assessment & Plan:  Optic neuritis due to multiple sclerosis Vision loss on the right eye New onset with abnormal MRI brain on outpatient evaluation by ophthalmology and sent to the ED for admission.Symptom onset 10 days PTA.  Neurology following, continue Solu-Medrol 1 g daily Day # 4/5.  MRI C-spine with and without no evidence of diminutively disease.  On completion of steroid she will be discharged with outpatient wrist follow-up  Nicotine dependence: Smoking cessation was discussed on admission.  Continue nicotine patch.  Morbid obesity BMI 46: Patient will benefit with weight loss healthy lifestyle.  She will need to follow-up with PCP  Leukocytosis this is due to steroid use.  Patient is febrile.  CBC in 1 week upon discharge  Diet Order             Diet regular Room service appropriate? Yes; Fluid consistency: Thin  Diet effective now                 Patient's Body mass index is 46.02 kg/m.  DVT prophylaxis:  SQ  Lovenox Code Status:   Code Status: Full Code  Family Communication: plan of care discussed with patient at bedside.  Discussed with patient's mother at the bedside 7/8.  Status is: Inpatient Remains inpatient appropriate because:IV treatments appropriate due to intensity of illness or inability to take PO and Inpatient level of care appropriate due to severity of illness Dispo: The patient is from: Home              Anticipated d/c is to: Home tomorrow .              Patient currently is not medically stable to d/c.   Difficult to place patient No  Unresulted Labs (From admission, onward)     Start     Ordered   12/13/20 0500  Creatinine, serum  (enoxaparin (LOVENOX)    CrCl >/= 30 ml/min)  Weekly,   STAT     Comments: while on enoxaparin therapy    12/06/20 1439   12/08/20 1343  JC Virus DNA,PCR (Whole Blood)  Once,   R        12/08/20 1343           Medications reviewed:  Scheduled Meds:  enoxaparin (LOVENOX) injection  65 mg Subcutaneous Q24H   nicotine  21 mg Transdermal Daily   sodium chloride flush  3 mL Intravenous Q12H   Continuous Infusions:  sodium chloride     methylPREDNISolone (SOLU-MEDROL) injection 1,000 mg (12/08/20 1012)  Consultants:see note  Procedures:see note  Antimicrobials: Anti-infectives (From admission, onward)    None      Culture/Microbiology No results found for: SDES, SPECREQUEST, CULT, REPTSTATUS  Other culture-see note  Objective: Vitals: Today's Vitals   12/08/20 1701 12/08/20 2121 12/09/20 0029 12/09/20 0452  BP: (!) 153/93 127/81 127/90 140/89  Pulse: 78 76 (!) 59 63  Resp: 17 16 16 18   Temp: 98.1 F (36.7 C) (!) 97.5 F (36.4 C) (!) 97.5 F (36.4 C) (!) 97.5 F (36.4 C)  TempSrc:  Oral Oral Oral  SpO2: 98% 96% 97% 98%  Weight:      Height:      PainSc:  0-No pain      Intake/Output Summary (Last 24 hours) at 12/09/2020 0717 Last data filed at 12/08/2020 1847 Gross per 24 hour  Intake 720 ml  Output --  Net  720 ml    Filed Weights   12/06/20 1615  Weight: 129.3 kg   Weight change:   Intake/Output from previous day: 07/09 0701 - 07/10 0700 In: 720 [P.O.:720] Out: -  Intake/Output this shift: No intake/output data recorded. Filed Weights   12/06/20 1615  Weight: 129.3 kg    Examination: General exam: AAOx 3, older than stated age, weak appearing. HEENT:Oral mucosa moist, Ear/Nose WNL grossly, dentition normal. Respiratory system: bilaterally diminished,no use of accessory muscle Cardiovascular system: S1 & S2 +, No JVD,. Gastrointestinal system: Abdomen soft,NT,ND, BS+ Nervous System:Alert, awake, vision on rt eyes improving,  moving extremities and grossly nonfocal Extremities: no edema, distal peripheral pulses palpable.  Skin: No rashes,no icterus. MSK: Normal muscle bulk,tone, power   Data Reviewed: I have personally reviewed following labs and imaging studies CBC: Recent Labs  Lab 12/06/20 1217 12/08/20 1359  WBC 9.8 17.3*  NEUTROABS  --  16.1*  HGB 14.7 15.7*  HCT 43.1 46.9*  MCV 90.5 92.3  PLT 360 386    Basic Metabolic Panel: Recent Labs  Lab 12/06/20 1217 12/08/20 1359  NA 138 139  K 3.6 4.1  CL 107 108  CO2 23 25  GLUCOSE 141* 144*  BUN 7 15  CREATININE 0.75 0.96  CALCIUM 8.5* 8.9    GFR: Estimated Creatinine Clearance: 103.1 mL/min (by C-G formula based on SCr of 0.96 mg/dL). Liver Function Tests: Recent Labs  Lab 12/08/20 1359  AST 24  ALT 19  ALKPHOS 68  BILITOT 0.5  PROT 7.6  ALBUMIN 3.7   No results for input(s): LIPASE, AMYLASE in the last 168 hours. No results for input(s): AMMONIA in the last 168 hours. Coagulation Profile: No results for input(s): INR, PROTIME in the last 168 hours. Cardiac Enzymes: No results for input(s): CKTOTAL, CKMB, CKMBINDEX, TROPONINI in the last 168 hours. BNP (last 3 results) No results for input(s): PROBNP in the last 8760 hours. HbA1C: No results for input(s): HGBA1C in the last 72  hours. CBG: No results for input(s): GLUCAP in the last 168 hours. Lipid Profile: No results for input(s): CHOL, HDL, LDLCALC, TRIG, CHOLHDL, LDLDIRECT in the last 72 hours. Thyroid Function Tests: No results for input(s): TSH, T4TOTAL, FREET4, T3FREE, THYROIDAB in the last 72 hours. Anemia Panel: Recent Labs    12/08/20 1359  VITAMINB12 174*   Sepsis Labs: No results for input(s): PROCALCITON, LATICACIDVEN in the last 168 hours.  Recent Results (from the past 240 hour(s))  Resp Panel by RT-PCR (Flu A&B, Covid) Nasopharyngeal Swab     Status: None   Collection Time: 12/06/20  2:05 PM  Specimen: Nasopharyngeal Swab; Nasopharyngeal(NP) swabs in vial transport medium  Result Value Ref Range Status   SARS Coronavirus 2 by RT PCR NEGATIVE NEGATIVE Final    Comment: (NOTE) SARS-CoV-2 target nucleic acids are NOT DETECTED.  The SARS-CoV-2 RNA is generally detectable in upper respiratory specimens during the acute phase of infection. The lowest concentration of SARS-CoV-2 viral copies this assay can detect is 138 copies/mL. A negative result does not preclude SARS-Cov-2 infection and should not be used as the sole basis for treatment or other patient management decisions. A negative result may occur with  improper specimen collection/handling, submission of specimen other than nasopharyngeal swab, presence of viral mutation(s) within the areas targeted by this assay, and inadequate number of viral copies(<138 copies/mL). A negative result must be combined with clinical observations, patient history, and epidemiological information. The expected result is Negative.  Fact Sheet for Patients:  BloggerCourse.com  Fact Sheet for Healthcare Providers:  SeriousBroker.it  This test is no t yet approved or cleared by the [place] FDA and  has been authorized for detection and/or diagnosis of SARS-CoV-2 by FDA under an Emergency Use  Authorization (EUA). This EUA will remain  in effect (meaning this test can be used) for the duration of the COVID-19 declaration under Section 564(b)(1) of the Act, 21 U.S.C.section 360bbb-3(b)(1), unless the authorization is terminated  or revoked sooner.       Influenza A by PCR NEGATIVE NEGATIVE Final   Influenza B by PCR NEGATIVE NEGATIVE Final    Comment: (NOTE) The Xpert Xpress SARS-CoV-2/FLU/RSV plus assay is intended as an aid in the diagnosis of influenza from Nasopharyngeal swab specimens and should not be used as a sole basis for treatment. Nasal washings and aspirates are unacceptable for Xpert Xpress SARS-CoV-2/FLU/RSV testing.  Fact Sheet for Patients: BloggerCourse.com  Fact Sheet for Healthcare Providers: SeriousBroker.it  This test is not yet approved or cleared by the [place] FDA and has been authorized for detection and/or diagnosis of SARS-CoV-2 by FDA under an Emergency Use Authorization (EUA). This EUA will remain in effect (meaning this test can be used) for the duration of the COVID-19 declaration under Section 564(b)(1) of the Act, 21 U.S.C. section 360bbb-3(b)(1), unless the authorization is terminated or revoked.  Performed at [facility], [address], [place], [place] [address]    Radiology Studies: MR CERVICAL SPINE W WO CONTRAST  Result Date: 12/07/2020 CLINICAL DATA:  Initial evaluation for multiple sclerosis, baseline. EXAM: MRI CERVICAL SPINE WITHOUT AND WITH CONTRAST TECHNIQUE: Multiplanar and multiecho pulse sequences of the cervical spine, to include the craniocervical junction and cervicothoracic junction, were obtained without and with intravenous contrast. CONTRAST:  55mL GADAVIST GADOBUTROL 1 MMOL/ML IV SOLN COMPARISON:  Prior outside MRI from 12/06/2020. FINDINGS: Alignment: Straightening of the normal cervical lordosis. No listhesis. Vertebrae: Vertebral body height  maintained without acute or chronic fracture. Bone marrow signal intensity within normal limits. Small benign hemangioma noted within the T1 vertebral body. No other discrete or worrisome osseous lesions. No abnormal marrow edema or enhancement. Cord: Signal intensity within the cervical spinal cord is within normal limits. No convincing cord signal abnormality to suggest demyelinating disease identified. No made of an apparent small focus of T2 signal intensity involving the ventral/central cord at the level of C7-T1 on sagittal T2 weighted sequence (series 5, image 8), not definitely seen on corresponding sequences, and favored to be artifactual. No abnormal enhancement. Normal cord caliber and morphology. Posterior Fossa, vertebral arteries, paraspinal tissues: Minimal cerebellar tonsillar  ectopia without frank Chiari malformation. Probable focus of T2 signal abnormality noted involving the dorsal pons/brainstem, likely related to multiple sclerosis as seen on prior brain MRI. Craniocervical junction within normal limits. Paraspinous and prevertebral soft tissues normal. Normal flow voids seen within the vertebral arteries bilaterally. Disc levels: C2-C3: Unremarkable. C3-C4:  Unremarkable. C4-C5: Mild uncovertebral hypertrophy without significant disc bulge. No spinal stenosis. Foramina remain patent. C5-C6: Broad-based posterior disc osteophyte complex flattens and partially effaces the ventral thecal sac, eccentric to the right. Mild spinal stenosis without frank cord impingement. Foramina remain patent. C6-C7: Small right paracentral disc protrusion indents the right ventral thecal sac (series 9, image 22). No significant spinal stenosis or cord deformity, although the ventral right C7 nerve root could be affected. Foramina remain patent. C7-T1:  Unremarkable. Visualized upper thoracic spine demonstrates no significant finding. IMPRESSION: 1. Normal MRI appearance of the cervical spinal cord, with no evidence  for demyelinating disease. No abnormal enhancement. 2. Small right paracentral disc protrusion at C6-7, potentially affecting the ventral right C7 nerve root. 3. Right eccentric disc osteophyte complex at C\5-6 with resultant mild spinal stenosis. Electronically Signed   By: Rise Mu M.D.   On: 12/07/2020 22:07     LOS: 3 days   Lanae Boast, MD [facility]  12/09/2020, 7:17 AM

## 2020-12-09 NOTE — Progress Notes (Signed)
She continues to have some blurred vision out of her right eye, very mild APD.  Today will be day four steroids, finish date tomorrow.  I discussed the diagnosis and plan of care with the patient.  Neurology will continue to follow.  Ritta Slot, MD [facility] [phone]  If 7pm- 7am, please page neurology on call as listed in AMION.

## 2020-12-10 MED ORDER — CYANOCOBALAMIN 1000 MCG PO TABS: 1000.0000 ug | ORAL_TABLET | Freq: Every day | ORAL | 0 refills | Status: DC

## 2020-12-10 MED ORDER — NICOTINE 21 MG/24HR TD PT24: 21.0000 mg | MEDICATED_PATCH | Freq: Every day | TRANSDERMAL | 0 refills | Status: DC

## 2020-12-10 MED ORDER — VITAMIN D (ERGOCALCIFEROL) 1.25 MG (50000 UNIT) PO CAPS
50000.0000 [IU] | ORAL_CAPSULE | ORAL | Status: DC
  Administered 2020-12-10: 50000 [IU] via ORAL
  Filled 2020-12-10: qty 1

## 2020-12-10 MED ORDER — VITAMIN B-12 1000 MCG PO TABS: 1000.0000 ug | ORAL_TABLET | Freq: Every day | ORAL | Status: DC

## 2020-12-10 MED ORDER — VITAMIN D (ERGOCALCIFEROL) 1.25 MG (50000 UNIT) PO CAPS: 50000.0000 [IU] | ORAL_CAPSULE | ORAL | 0 refills | Status: AC

## 2020-12-10 MED ORDER — CYANOCOBALAMIN 1000 MCG/ML IJ SOLN
1000.0000 ug | Freq: Once | INTRAMUSCULAR | Status: AC
  Administered 2020-12-10: 1000 ug via INTRAMUSCULAR
  Filled 2020-12-10: qty 1

## 2020-12-10 NOTE — Discharge Summary (Signed)
Physician Discharge Summary  Mackenzie Griffin OIN:[id number] DOB: August 22, 1975 DOA: 12/06/2020  PCP: Pcp, No  Admit date: 12/06/2020 Discharge date: 12/10/2020  Admitted From: home Disposition:  home  Recommendations for Outpatient Follow-up:  Follow up with PCP and w/ neurology in 1-2 weeks Please obtain BMP/CBC in one week  Home Health:no  Equipment/Devices: none  Discharge Condition: Stable Code Status:   Code Status: Full Code Diet recommendation:  Diet Order             Diet - low sodium heart healthy           Diet regular Room service appropriate? Yes; Fluid consistency: Thin  Diet effective now                  Brief/Interim Summary: 45 year old female with history of morbid obesity nicotine dependence presented to the ED on referral from ophthalmologist for evaluation of visual loss of the right eye as well as pain in the right eye with movement x10 days.   She had work-up by ophthalmology as outpatient MRI of the brain consistent with optic neuritis and possible demyelinating process. Neurology was consulted patient was placed on high-dose steroid and admitted for further management. Seen by neurology MRI C-spine no acute finding, plan for total 5 days of 1 g Solu-Medrol end date 7/11 after especially be discharged home with neurology follow-up as outpatient  Discharge Diagnoses:  Optic neuritis due to multiple sclerosis Vision loss on the right eye New onset with abnormal MRI brain on outpatient evaluation by ophthalmology and sent to the ED for admission.Symptom onset 10 days PTA.  Seen by neurology-treated with Solu-Medrol 1 g daily x 5.  MRI C-spine with and without evidence of demyelinating disease.  COVID outpatient neurology follow-up.   Nicotine dependence: Smoking cessation was discussed on admission.  Continue nicotine patch.   Morbid obesity BMI 46: Patient will benefit with weight loss healthy lifestyle.  She will need to follow-up with PCP.   Leukocytosis  this is due to steroid use.  Patient is febrile.  CBC in 1 week upon discharge B12 deficiency replacement given continue oral supplementation follow with PCP Vitamin D deficiency continue weekly high-dose supplementation  Consults: Neurology  Subjective:  AAAOx3,Rt vision getting better.  Discharge Exam: Vitals:   12/10/20 0432 12/10/20 0852  BP: (!) 147/77 (!) 152/85  Pulse: (!) 53 (!) 56  Resp: 18 16  Temp: (!) 97.4 F (36.3 C) 97.9 F (36.6 C)  SpO2: 99% 97%   General: Pt is alert, awake, not in acute distress Cardiovascular: RRR, S1/S2 +, no rubs, no gallops Respiratory: CTA bilaterally, no wheezing, no rhonchi Abdominal: Soft, NT, ND, bowel sounds + Extremities: no edema, no cyanosis  Discharge Instructions  Discharge Instructions     Ambulatory referral to Neurology   Complete by: As directed    An appointment is requested in approximately: 2 If unable to follow-up with neurology at [facility] can follow-up with [facility]   Diet - low sodium heart healthy   Complete by: As directed    Discharge instructions   Complete by: As directed    Please call call MD or return to ER for similar or worsening recurring problem that brought you to hospital or if any fever,nausea/vomiting,abdominal pain, uncontrolled pain, chest pain,  shortness of breath or any other alarming symptoms.  Please follow-up with neurology in the number provided.   Please follow-up your doctor as instructed in a week time and call the office for appointment.  Please avoid alcohol, smoking, or any other illicit substance and maintain healthy habits including taking your regular medications as prescribed.  You were cared for by a hospitalist during your hospital stay. If you have any questions about your discharge medications or the care you received while you were in the hospital after you are discharged, you can call the unit and ask to speak with the hospitalist on call if the hospitalist that  took care of you is not available.  Once you are discharged, your primary care physician will handle any further medical issues. Please note that NO REFILLS for any discharge medications will be authorized once you are discharged, as it is imperative that you return to your primary care physician (or establish a relationship with a primary care physician if you do not have one) for your aftercare needs so that they can reassess your need for medications and monitor your lab values   Increase activity slowly   Complete by: As directed       Allergies as of 12/10/2020   No Known Allergies      Medication List     TAKE these medications    cyanocobalamin 1000 MCG tablet Take 1 tablet (1,000 mcg total) by mouth daily. Start taking on: December 11, 2020   nicotine 21 mg/24hr patch Commonly known as: NICODERM CQ - dosed in mg/24 hours Place 1 patch (21 mg total) onto the skin daily. Start taking on: December 11, 2020   Vitamin D (Ergocalciferol) 1.25 MG (50000 UNIT) Caps capsule Commonly known as: DRISDOL Take 1 capsule (50,000 Units total) by mouth every 7 (seven) days for 6 doses.        Follow-up Information     Lonell Face, MD. Call in 1 week(s).   Specialty: Neurology Contact information: [address] [facility] [place] [address] [phone]                No Known Allergies  The results of significant diagnostics from this hospitalization (including imaging, microbiology, ancillary and laboratory) are listed below for reference.    Microbiology: Recent Results (from the past 240 hour(s))  Resp Panel by RT-PCR (Flu A&B, Covid) Nasopharyngeal Swab     Status: None   Collection Time: 12/06/20  2:05 PM   Specimen: Nasopharyngeal Swab; Nasopharyngeal(NP) swabs in vial transport medium  Result Value Ref Range Status   SARS Coronavirus 2 by RT PCR NEGATIVE NEGATIVE Final    Comment: (NOTE) SARS-CoV-2 target nucleic acids are NOT  DETECTED.  The SARS-CoV-2 RNA is generally detectable in upper respiratory specimens during the acute phase of infection. The lowest concentration of SARS-CoV-2 viral copies this assay can detect is 138 copies/mL. A negative result does not preclude SARS-Cov-2 infection and should not be used as the sole basis for treatment or other patient management decisions. A negative result may occur with  improper specimen collection/handling, submission of specimen other than nasopharyngeal swab, presence of viral mutation(s) within the areas targeted by this assay, and inadequate number of viral copies(<138 copies/mL). A negative result must be combined with clinical observations, patient history, and epidemiological information. The expected result is Negative.  Fact Sheet for Patients:  BloggerCourse.com  Fact Sheet for Healthcare Providers:  SeriousBroker.it  This test is no t yet approved or cleared by the [place] FDA and  has been authorized for detection and/or diagnosis of SARS-CoV-2 by FDA under an Emergency Use Authorization (EUA). This EUA will remain  in effect (meaning  this test can be used) for the duration of the COVID-19 declaration under Section 564(b)(1) of the Act, 21 U.S.C.section 360bbb-3(b)(1), unless the authorization is terminated  or revoked sooner.       Influenza A by PCR NEGATIVE NEGATIVE Final   Influenza B by PCR NEGATIVE NEGATIVE Final    Comment: (NOTE) The Xpert Xpress SARS-CoV-2/FLU/RSV plus assay is intended as an aid in the diagnosis of influenza from Nasopharyngeal swab specimens and should not be used as a sole basis for treatment. Nasal washings and aspirates are unacceptable for Xpert Xpress SARS-CoV-2/FLU/RSV testing.  Fact Sheet for Patients: BloggerCourse.com  Fact Sheet for Healthcare Providers: SeriousBroker.it  This test is not yet  approved or cleared by the [place] FDA and has been authorized for detection and/or diagnosis of SARS-CoV-2 by FDA under an Emergency Use Authorization (EUA). This EUA will remain in effect (meaning this test can be used) for the duration of the COVID-19 declaration under Section 564(b)(1) of the Act, 21 U.S.C. section 360bbb-3(b)(1), unless the authorization is terminated or revoked.  Performed at [facility], [address] [place] [place], [place] [address]     Procedures/Studies: MR CERVICAL SPINE W WO CONTRAST  Result Date: 12/07/2020 CLINICAL DATA:  Initial evaluation for multiple sclerosis, baseline. EXAM: MRI CERVICAL SPINE WITHOUT AND WITH CONTRAST TECHNIQUE: Multiplanar and multiecho pulse sequences of the cervical spine, to include the craniocervical junction and cervicothoracic junction, were obtained without and with intravenous contrast. CONTRAST:  79mL GADAVIST GADOBUTROL 1 MMOL/ML IV SOLN COMPARISON:  Prior outside MRI from 12/06/2020. FINDINGS: Alignment: Straightening of the normal cervical lordosis. No listhesis. Vertebrae: Vertebral body height maintained without acute or chronic fracture. Bone marrow signal intensity within normal limits. Small benign hemangioma noted within the T1 vertebral body. No other discrete or worrisome osseous lesions. No abnormal marrow edema or enhancement. Cord: Signal intensity within the cervical spinal cord is within normal limits. No convincing cord signal abnormality to suggest demyelinating disease identified. No made of an apparent small focus of T2 signal intensity involving the ventral/central cord at the level of C7-T1 on sagittal T2 weighted sequence (series 5, image 8), not definitely seen on corresponding sequences, and favored to be artifactual. No abnormal enhancement. Normal cord caliber and morphology. Posterior Fossa, vertebral arteries, paraspinal tissues: Minimal cerebellar tonsillar ectopia without frank Chiari  malformation. Probable focus of T2 signal abnormality noted involving the dorsal pons/brainstem, likely related to multiple sclerosis as seen on prior brain MRI. Craniocervical junction within normal limits. Paraspinous and prevertebral soft tissues normal. Normal flow voids seen within the vertebral arteries bilaterally. Disc levels: C2-C3: Unremarkable. C3-C4:  Unremarkable. C4-C5: Mild uncovertebral hypertrophy without significant disc bulge. No spinal stenosis. Foramina remain patent. C5-C6: Broad-based posterior disc osteophyte complex flattens and partially effaces the ventral thecal sac, eccentric to the right. Mild spinal stenosis without frank cord impingement. Foramina remain patent. C6-C7: Small right paracentral disc protrusion indents the right ventral thecal sac (series 9, image 22). No significant spinal stenosis or cord deformity, although the ventral right C7 nerve root could be affected. Foramina remain patent. C7-T1:  Unremarkable. Visualized upper thoracic spine demonstrates no significant finding. IMPRESSION: 1. Normal MRI appearance of the cervical spinal cord, with no evidence for demyelinating disease. No abnormal enhancement. 2. Small right paracentral disc protrusion at C6-7, potentially affecting the ventral right C7 nerve root. 3. Right eccentric disc osteophyte complex at C\5-6 with resultant mild spinal stenosis. Electronically Signed   By: Rise Mu M.D.   On: 12/07/2020  22:07    Labs: BNP (last 3 results) No results for input(s): BNP in the last 8760 hours. Basic Metabolic Panel: Recent Labs  Lab 12/06/20 1217 12/08/20 1359  NA 138 139  K 3.6 4.1  CL 107 108  CO2 23 25  GLUCOSE 141* 144*  BUN 7 15  CREATININE 0.75 0.96  CALCIUM 8.5* 8.9   Liver Function Tests: Recent Labs  Lab 12/08/20 1359  AST 24  ALT 19  ALKPHOS 68  BILITOT 0.5  PROT 7.6  ALBUMIN 3.7   No results for input(s): LIPASE, AMYLASE in the last 168 hours. No results for input(s):  AMMONIA in the last 168 hours. CBC: Recent Labs  Lab 12/06/20 1217 12/08/20 1359  WBC 9.8 17.3*  NEUTROABS  --  16.1*  HGB 14.7 15.7*  HCT 43.1 46.9*  MCV 90.5 92.3  PLT 360 386   Cardiac Enzymes: No results for input(s): CKTOTAL, CKMB, CKMBINDEX, TROPONINI in the last 168 hours. BNP: Invalid input(s): POCBNP CBG: No results for input(s): GLUCAP in the last 168 hours. D-Dimer No results for input(s): DDIMER in the last 72 hours. Hgb A1c No results for input(s): HGBA1C in the last 72 hours. Lipid Profile No results for input(s): CHOL, HDL, LDLCALC, TRIG, CHOLHDL, LDLDIRECT in the last 72 hours. Thyroid function studies No results for input(s): TSH, T4TOTAL, T3FREE, THYROIDAB in the last 72 hours.  Invalid input(s): FREET3 Anemia work up Recent Labs    12/08/20 1359  VITAMINB12 174*   Urinalysis    Component Value Date/Time   COLORURINE YELLOW 09/26/2013 1007   APPEARANCEUR CLOUDY 09/26/2013 1007   LABSPEC 1.010 09/26/2013 1007   PHURINE 7.5 09/26/2013 1007   GLUCOSEU NEGATIVE 09/26/2013 1007   HGBUR 2+ 09/26/2013 1007   BILIRUBINUR NEGATIVE 09/26/2013 1007   KETONESUR NEGATIVE 09/26/2013 1007   PROTEINUR 100 mg/dL 16/03/9603 5409   NITRITE NEGATIVE 09/26/2013 1007   LEUKOCYTESUR 3+ 09/26/2013 1007   Sepsis Labs Invalid input(s): PROCALCITONIN,  WBC,  LACTICIDVEN Microbiology Recent Results (from the past 240 hour(s))  Resp Panel by RT-PCR (Flu A&B, Covid) Nasopharyngeal Swab     Status: None   Collection Time: 12/06/20  2:05 PM   Specimen: Nasopharyngeal Swab; Nasopharyngeal(NP) swabs in vial transport medium  Result Value Ref Range Status   SARS Coronavirus 2 by RT PCR NEGATIVE NEGATIVE Final    Comment: (NOTE) SARS-CoV-2 target nucleic acids are NOT DETECTED.  The SARS-CoV-2 RNA is generally detectable in upper respiratory specimens during the acute phase of infection. The lowest concentration of SARS-CoV-2 viral copies this assay can detect is 138  copies/mL. A negative result does not preclude SARS-Cov-2 infection and should not be used as the sole basis for treatment or other patient management decisions. A negative result may occur with  improper specimen collection/handling, submission of specimen other than nasopharyngeal swab, presence of viral mutation(s) within the areas targeted by this assay, and inadequate number of viral copies(<138 copies/mL). A negative result must be combined with clinical observations, patient history, and epidemiological information. The expected result is Negative.  Fact Sheet for Patients:  BloggerCourse.com  Fact Sheet for Healthcare Providers:  SeriousBroker.it  This test is no t yet approved or cleared by the [place] FDA and  has been authorized for detection and/or diagnosis of SARS-CoV-2 by FDA under an Emergency Use Authorization (EUA). This EUA will remain  in effect (meaning this test can be used) for the duration of the COVID-19 declaration under Section 564(b)(1) of the Act, 21  U.S.C.section 360bbb-3(b)(1), unless the authorization is terminated  or revoked sooner.       Influenza A by PCR NEGATIVE NEGATIVE Final   Influenza B by PCR NEGATIVE NEGATIVE Final    Comment: (NOTE) The Xpert Xpress SARS-CoV-2/FLU/RSV plus assay is intended as an aid in the diagnosis of influenza from Nasopharyngeal swab specimens and should not be used as a sole basis for treatment. Nasal washings and aspirates are unacceptable for Xpert Xpress SARS-CoV-2/FLU/RSV testing.  Fact Sheet for Patients: BloggerCourse.comhttps://www.fda.gov/media/152166/download  Fact Sheet for Healthcare Providers: SeriousBroker.ithttps://www.fda.gov/media/152162/download  This test is not yet approved or cleared by the [place] FDA and has been authorized for detection and/or diagnosis of SARS-CoV-2 by FDA under an Emergency Use Authorization (EUA). This EUA will remain in effect (meaning  this test can be used) for the duration of the COVID-19 declaration under Section 564(b)(1) of the Act, 21 U.S.C. section 360bbb-3(b)(1), unless the authorization is terminated or revoked.  Performed at [facility], [address] [place], [place] [address]      Time coordinating discharge: 25 minutes  SIGNED: Lanae Boastamesh Ahliyah Nienow, MD  [facility] 12/10/2020, 9:07 AM  If 7PM-7AM, please contact night-coverage [email or web address]

## 2020-12-10 NOTE — Progress Notes (Signed)
Pt being discharged, discharge instructions reviewed with pt, states understanding, pt with no complaints at discharge

## 2020-12-13 ENCOUNTER — Encounter: Payer: Self-pay | Admitting: Neurology

## 2020-12-13 ENCOUNTER — Ambulatory Visit: Payer: Managed Care, Other (non HMO) | Admitting: Neurology

## 2020-12-13 ENCOUNTER — Other Ambulatory Visit: Payer: Self-pay

## 2020-12-13 DIAGNOSIS — Z79899 Other long term (current) drug therapy: Secondary | ICD-10-CM

## 2020-12-13 DIAGNOSIS — R9082 White matter disease, unspecified: Secondary | ICD-10-CM | POA: Diagnosis not present

## 2020-12-13 DIAGNOSIS — H469 Unspecified optic neuritis: Secondary | ICD-10-CM | POA: Diagnosis not present

## 2020-12-13 DIAGNOSIS — H539 Unspecified visual disturbance: Secondary | ICD-10-CM | POA: Diagnosis not present

## 2020-12-13 DIAGNOSIS — G35 Multiple sclerosis: Secondary | ICD-10-CM

## 2020-12-13 DIAGNOSIS — L409 Psoriasis, unspecified: Secondary | ICD-10-CM

## 2020-12-13 DIAGNOSIS — E559 Vitamin D deficiency, unspecified: Secondary | ICD-10-CM

## 2020-12-13 NOTE — Progress Notes (Signed)
[facility]  PATIENT: Mackenzie Griffin DOB: 1976-01-19  REFERRING DOCTOR OR PCP:  Lanae Boast SOURCE: patinr, notes from admission, labs, imaging reports, MRI personally reviewed.    _________________________________   HISTORICAL  CHIEF COMPLAINT:  Chief Complaint  Patient presents with   New Patient (Initial Visit)    Pt with mom, rm 2. Presents today to address possible concerns for MS. She developed vision loss on the right side. She went to eye MD and they completed a work up and suggested MRI. MRI was concerning for MS and they advised her to go to ER. She was admitted to hospital for a week and given high dose steroids. Repeated MRI and was advised to follow up here.    HISTORY OF PRESENT ILLNESS:  I had the pleasure of seeing your patient, Mackenzie Griffin, at the MS center [facility] for neurologic examination regarding her multiple sclerosis.  She is a 45 year old woman who had the onset of right visual changes and pain with eye movements late June 2022.  There was reduced vision in most of the visual field, including central.   She saw ophthalmology and optic neuritis was suspected.    She had an MRI of the brain, worrisome for right optic neuriits.  She was sent to the ED and admitted for additional evaluation and IV steroids.  Vision has improved but not to baseline.     She has no other episodes of visual changes, numbness, clumsiness or weakness.  She has no vascular risk factors.      She has psoriasis but is otherwise healthy.   She was never on a TNF acting agent.      Her Vit D was very low in the hospital so high dose supplements were started.  I personally reviewed recent MRI images: MRI of the brain 12/05/2020 shows scattered T2/FLAIR hyperintense foci in the periventricular, juxtacortical and deep white matter of the hemispheres.  None of the foci enhance after contrast.  MRI of the orbits 12/05/2020 shows hyperintensity of the  right optic nerve best seen on the thin section coronal images.  There could be subtle retrobulbar or enhancement.  MRI of the cervical spine 12/07/2020 shows a normal spinal cord.  Mild degenerative changes at C5-C6 and C6-C7 do not cause significant spinal stenosis and no nerve root compression.  Labs: 11/2020:  B12 was slightly low (174), HIV negative, vit D low (8.1), CBC/D and CMP unremarkable.    REVIEW OF SYSTEMS: Constitutional: No fevers, chills, sweats, or change in appetite Eyes: No visual changes, double vision, eye pain Ear, nose and throat: No hearing loss, ear pain, nasal congestion, sore throat Cardiovascular: No chest pain, palpitations Respiratory:  No shortness of breath at rest or with exertion.   No wheezes GastrointestinaI: No nausea, vomiting, diarrhea, abdominal pain, fecal incontinence Genitourinary:  No dysuria, urinary retention or frequency.  No nocturia. Musculoskeletal:  No neck pain, back pain Integumentary: No rash, pruritus, skin lesions Neurological: as above Psychiatric: No depression at this time.  No anxiety Endocrine: No palpitations, diaphoresis, change in appetite, change in weigh or increased thirst Hematologic/Lymphatic:  No anemia, purpura, petechiae. Allergic/Immunologic: No itchy/runny eyes, nasal congestion, recent allergic reactions, rashes  ALLERGIES: No Known Allergies  HOME MEDICATIONS:  Current Outpatient Medications:    nicotine (NICODERM CQ - DOSED IN MG/24 HOURS) 21 mg/24hr patch, Place 1 patch (21 mg total) onto the skin daily., Disp: 28 patch, Rfl: 0   Vitamin D, Ergocalciferol, (DRISDOL) 1.25  MG (50000 UNIT) CAPS capsule, Take 1 capsule (50,000 Units total) by mouth every 7 (seven) days for 6 doses., Disp: 5 capsule, Rfl: 0  PAST MEDICAL HISTORY: History reviewed. No pertinent past medical history.  PAST SURGICAL HISTORY: History reviewed. No pertinent surgical history.  FAMILY HISTORY: Family History  Problem Relation Age  of Onset   Coronary artery disease Father     SOCIAL HISTORY:  Social History   Socioeconomic History   Marital status: Single    Spouse name: Not on file   Number of children: Not on file   Years of education: Not on file   Highest education level: Not on file  Occupational History   Not on file  Tobacco Use   Smoking status: Every Day    Packs/day: 1.00    Types: Cigarettes   Smokeless tobacco: Never  Substance and Sexual Activity   Alcohol use: No   Drug use: Never   Sexual activity: Not on file  Other Topics Concern   Not on file  Social History Narrative   Not on file   Social Determinants of Health   Financial Resource Strain: Not on file  Food Insecurity: Not on file  Transportation Needs: Not on file  Physical Activity: Not on file  Stress: Not on file  Social Connections: Not on file  Intimate Partner Violence: Not on file     PHYSICAL EXAM  There were no vitals filed for this visit.  There is no height or weight on file to calculate BMI.  Vision Screening   Right eye Left eye Both eyes  Without correction     With correction 20/40 20/40 20/40      General: The patient is well-developed and well-nourished and in no acute distress  HEENT:  Head is [place]/AT.  Sclera are anicteric.  Funduscopic exam shows normal optic discs and retinal vessels.  Neck: No carotid bruits are noted.  The neck is nontender.  Cardiovascular: The heart has a regular rate and rhythm with a normal S1 and S2. There were no murmurs, gallops or rubs.    Skin: Extremities are without rash or  edema.  Musculoskeletal:  Back is nontender  Neurologic Exam  Mental status: The patient is alert and oriented x 3 at the time of the examination. The patient has apparent normal recent and remote memory, with an apparently normal attention span and concentration ability.   Speech is normal.  Cranial nerves: Extraocular movements are full. Pupils show 2+ right APD.  Visual fields are  full.  Facial symmetry is present. There is good facial sensation to soft touch bilaterally.Facial strength is normal.  Trapezius and sternocleidomastoid strength is normal. No dysarthria is noted.  The tongue is midline, and the patient has symmetric elevation of the soft palate. No obvious hearing deficits are noted.  Motor:  Muscle bulk is normal.   Tone is normal. Strength is  5 / 5 in all 4 extremities.   Sensory: Sensory testing is intact to pinprick, soft touch and vibration sensation in all 4 extremities.  Coordination: Cerebellar testing reveals good finger-nose-finger and heel-to-shin bilaterally.  Gait and station: Station is normal.   Gait is normal. Tandem gait is normal. Romberg is negative.   Reflexes: Deep tendon reflexes are symmetric and normal bilaterally.   Plantar responses are flexor.    DIAGNOSTIC DATA (LABS, IMAGING, TESTING) - I reviewed patient records, labs, notes, testing and imaging myself where available.  Lab Results  Component Value Date  WBC 17.3 (H) 12/08/2020   HGB 15.7 (H) 12/08/2020   HCT 46.9 (H) 12/08/2020   MCV 92.3 12/08/2020   PLT 386 12/08/2020      Component Value Date/Time   NA 139 12/08/2020 1359   K 4.1 12/08/2020 1359   CL 108 12/08/2020 1359   CO2 25 12/08/2020 1359   GLUCOSE 144 (H) 12/08/2020 1359   BUN 15 12/08/2020 1359   CREATININE 0.96 12/08/2020 1359   CALCIUM 8.9 12/08/2020 1359   PROT 7.6 12/08/2020 1359   ALBUMIN 3.7 12/08/2020 1359   AST 24 12/08/2020 1359   ALT 19 12/08/2020 1359   ALKPHOS 68 12/08/2020 1359   BILITOT 0.5 12/08/2020 1359   GFRNONAA >60 12/08/2020 1359   No results found for: CHOL, HDL, LDLCALC, LDLDIRECT, TRIG, CHOLHDL No results found for: SVXB9T Lab Results  Component Value Date   VITAMINB12 174 (L) 12/08/2020   No results found for: TSH     ASSESSMENT AND PLAN  Optic neuritis - Plan: DG FL GUIDED LUMBAR PUNCTURE, QuantiFERON-TB Gold Plus, Stratify JCV Antibody Test ([facility]),  Pan-ANCA  White matter abnormality on MRI of brain - Plan: DG FL GUIDED LUMBAR PUNCTURE, QuantiFERON-TB Gold Plus, Stratify JCV Antibody Test ([facility]), Pan-ANCA  Vision disturbance  Psoriasis  Vitamin D deficiency  In summary, Ms. Bodner is a 45 year old woman who had optic neuritis in late June 2022 and was found on MRI to have T2/FLAIR hyperintense foci in the hemispheres in a pattern consistent with MS.  I discussed with her that I believe there is a high likelihood, probably around 90%, that she has MS.  However, she does not meet McDonald criteria (does not show dissemination in time) and I would like to be more certain of the diagnosis before having her start medications that she would be on for many years.  Therefore, I have recommended that we proceed with a lumbar puncture to check CSF for oligoclonal bands and elevated IgG index.  If she does have oligoclonal bands, she needs criteria and I will recommend that we begin a disease modifying therapy.  If she does not have oligoclonal bands, MS is still highly likely and we would need to repeat an MRI in about 6 months (and again 6 to 12 months later if negative) to determine if there is any progression over time that would confirm the diagnosis.  We did spend some time discussing some therapy options.  She would be most interested in the pill and would prefer Aubagio over dimethyl fumarate/Vumerity.  We will check a quant iferon TB test.  I will also check a pan ANCA to make sure she does not have findings that could be seen with polyarteritis nodosa, a mimic for MS.  I also discussed with her that she will need to refrain from using any of the anti-TNF agents often used for psoriasis as they may worsen demyelination.  She is advised to continue to take vitamin D.  She will return to see [place] in 3 months or sooner if there are new or worsening neurologic symptoms.  Thank you for asking me to see Ms. Plancarte.  Please let me know if I can be of further  assistance with her or other patients in the future.    Mussa Groesbeck A. Epimenio Foot, MD, [facility] 12/13/2020, 4:34 PM Certified in Neurology, Clinical Neurophysiology, Sleep Medicine and Neuroimaging  [facility] [address], [address] [place], [place] [address] [phone]

## 2020-12-17 ENCOUNTER — Telehealth: Payer: Self-pay | Admitting: Neurology

## 2020-12-17 NOTE — Telephone Encounter (Signed)
Order for lumbar puncture faxed to [facility] as requested. They will call patient to schedule. Phone: [phone].

## 2020-12-19 LAB — QUANTIFERON-TB GOLD PLUS
QuantiFERON Mitogen Value: >10 IU/mL
QuantiFERON Nil Value: 0 IU/mL
QuantiFERON TB1 Ag Value: 0 IU/mL
QuantiFERON TB2 Ag Value: 0 IU/mL
QuantiFERON-TB Gold Plus: NEGATIVE

## 2020-12-19 LAB — ANTI-MPO ANTIBODIES: Anti-MPO Antibodies: <0.2 units (ref 0.0–0.9)

## 2020-12-20 LAB — JC VIRUS DNA,PCR (WHOLE BLOOD): JC Virus DNA, PCR, Blood: NEGATIVE

## 2020-12-27 NOTE — Progress Notes (Signed)
Patient on schedule for LP 8/3, spoke with patient on phone, made aware to be here @ 0830, driver post procedure.discharge. stated understanding.

## 2021-01-02 ENCOUNTER — Other Ambulatory Visit: Payer: Self-pay

## 2021-01-02 ENCOUNTER — Ambulatory Visit
Admission: RE | Admit: 2021-01-02 | Discharge: 2021-01-02 | Disposition: A | Discharge status: Home / Self Care | Payer: Managed Care, Other (non HMO) | Source: Ambulatory Visit | Attending: Neurology | Admitting: Neurology

## 2021-01-02 DIAGNOSIS — R9082 White matter disease, unspecified: Secondary | ICD-10-CM | POA: Insufficient documentation

## 2021-01-02 DIAGNOSIS — H469 Unspecified optic neuritis: Secondary | ICD-10-CM | POA: Insufficient documentation

## 2021-01-02 LAB — CSF CELL COUNT WITH DIFFERENTIAL
Eosinophils, CSF: 0 %
Lymphs, CSF: 98 %
Monocyte-Macrophage-Spinal Fluid: 2 %
RBC Count, CSF: 9 /mm3 — ABNORMAL HIGH (ref 0–3)
Segmented Neutrophils-CSF: 0 %
Tube #: 3
WBC, CSF: 5 /mm3 (ref 0–5)

## 2021-01-02 LAB — GLUCOSE, CSF: Glucose, CSF: 59 mg/dL (ref 40–70)

## 2021-01-02 LAB — PROTEIN, CSF: Total  Protein, CSF: 34 mg/dL (ref 15–45)

## 2021-01-02 MED ORDER — LIDOCAINE HCL (PF) 1 % IJ SOLN
5.0000 mL | Freq: Once | INTRAMUSCULAR | Status: AC
  Administered 2021-01-02: 5 mL
  Filled 2021-01-02: qty 5

## 2021-01-02 NOTE — Progress Notes (Signed)
Per Dr. Allena Katz, patient may be discharged to home one hour after procedure if having no adverse reactions or pain.

## 2021-01-02 NOTE — Progress Notes (Signed)
Patient transported to bay [facility]. Spoke with RN Terri.

## 2021-01-03 LAB — VDRL, CSF: VDRL Quant, CSF: NONREACTIVE

## 2021-01-04 LAB — OLIGOCLONAL BANDS, CSF + SERM

## 2021-01-07 ENCOUNTER — Telehealth: Payer: Self-pay | Admitting: Neurology

## 2021-01-07 DIAGNOSIS — G35 Multiple sclerosis: Secondary | ICD-10-CM

## 2021-01-07 DIAGNOSIS — Z79899 Other long term (current) drug therapy: Secondary | ICD-10-CM

## 2021-01-07 NOTE — Telephone Encounter (Signed)
I discussed the results of her recent lumbar puncture.  The CSF came back as showing 3 oligoclonal bands present in the CSF that were not present in the spinal fluid.  Combined with her optic neuritis, brain MRI consistent with MS and symptoms, she meets the criteria for multiple sclerosis.  We had already discussed treatment options and we quickly reviewed them.  She would like to begin Aubagio.  Lab work, including the TB test, was fine a couple weeks ago..  We can turn in her service request form.

## 2021-01-08 ENCOUNTER — Telehealth: Payer: Self-pay | Admitting: *Deleted

## 2021-01-08 NOTE — Telephone Encounter (Signed)
Located start form, waiting on MD signature and then will fax to [employer] one [employer] one

## 2021-01-08 NOTE — Telephone Encounter (Signed)
PA denied. Preferred drugs pt must try/fail: glatiramer and dimethyl fumarate. Pt has not tried either.   I faxed denial to [employer] to see if she would qualify for free drug. Waiting on response.

## 2021-01-08 NOTE — Telephone Encounter (Signed)
Faxed completed/signed Aubagio start form to [employer] at [phone]. Received fax confirmation. 

## 2021-01-08 NOTE — Telephone Encounter (Signed)
Submited PA on CMM. Key: [id number]. Waiting on determination from [place].

## 2021-01-14 NOTE — Telephone Encounter (Signed)
Received this notice from [employer]: "I sent a fax to your office on 01/08/2021 advising a level I Appeal will need to be submitted to the payer. Once you receive the outcome then we will need a copy of the denial . If the Level I Appeal is denied, and there is an option for another internal appeal (level II) then your office will also be required to submit that. If the Level I appeal is denied, and there are no additional internal appeal options then we can refer the patient to PAP."

## 2021-01-14 NOTE — Telephone Encounter (Signed)
I reached out to [employer] to find out the status of this.

## 2021-01-14 NOTE — Telephone Encounter (Signed)
Completed appeal letter. Dr. Epimenio Foot has reviewed and signed. Faxed back to [employer]. Received a receipt of confirmation.

## 2021-01-15 NOTE — Telephone Encounter (Signed)
Received additional questions related to appeal. Completed and Dr. Epimenio Foot reviewed and signed. Faxed back to [employer]. Received a receipt of confirmation.

## 2021-01-21 MED ORDER — AUBAGIO 14 MG PO TABS: 14.0000 mg | ORAL_TABLET | Freq: Every day | ORAL | 3 refills | Status: DC

## 2021-01-21 NOTE — Telephone Encounter (Signed)
The appeal for Aubagio was also denied.  I have sent this information to [employer] to find out if patient can be enrolled in PAP.

## 2021-01-21 NOTE — Addendum Note (Signed)
Addended by: Geronimo Running A on: 01/21/2021 01:33 PM   Modules accepted: Orders

## 2021-01-30 NOTE — Telephone Encounter (Signed)
Received a notification stating the patient has been approved through PAP 01/29/2021-01/29/2022

## 2021-02-01 ENCOUNTER — Telehealth: Payer: Self-pay | Admitting: Neurology

## 2021-02-01 NOTE — Telephone Encounter (Signed)
Can you follow up? Aubagio new start

## 2021-02-01 NOTE — Telephone Encounter (Signed)
I called pt. She has been on aubagio for almost one month. She is tolerating it well. She has a [employer] that is close to her in [place]. The order is in Epic. Patient will call the office and check with them to make sure that she can stop by for labs and they can see the order. She is aware that she will need LFTs once per month for 6 months.

## 2021-02-01 NOTE — Telephone Encounter (Signed)
See telephone note from 02/01/21.

## 2021-02-01 NOTE — Telephone Encounter (Signed)
Pt states she has never been contacted re: being scheduled for needed blood test, please call

## 2021-02-06 ENCOUNTER — Other Ambulatory Visit: Payer: Self-pay | Admitting: *Deleted

## 2021-02-06 DIAGNOSIS — Z79899 Other long term (current) drug therapy: Secondary | ICD-10-CM

## 2021-02-06 DIAGNOSIS — G35 Multiple sclerosis: Secondary | ICD-10-CM

## 2021-02-06 NOTE — Addendum Note (Signed)
Addended by: Rogene Houston on: 02/06/2021 02:42 PM   Modules accepted: Orders

## 2021-02-11 ENCOUNTER — Other Ambulatory Visit: Payer: Self-pay | Admitting: *Deleted

## 2021-02-11 ENCOUNTER — Telehealth: Payer: Self-pay | Admitting: Neurology

## 2021-02-11 DIAGNOSIS — Z79899 Other long term (current) drug therapy: Secondary | ICD-10-CM

## 2021-02-11 DIAGNOSIS — G35 Multiple sclerosis: Secondary | ICD-10-CM

## 2021-02-11 NOTE — Telephone Encounter (Signed)
Pt called, spoke with [facility], they do not have a order for my blood work. Would like a call from the nurse.

## 2021-02-11 NOTE — Telephone Encounter (Signed)
I spoke with her [facility] tech who was able to release the blood work order so it can be drawn at another location.  I spoke with the patient and let her know.  She will call [facility].  She was very appreciative for the call.

## 2021-02-12 ENCOUNTER — Other Ambulatory Visit: Payer: Self-pay | Admitting: Neurology

## 2021-02-12 ENCOUNTER — Telehealth: Payer: Self-pay | Admitting: *Deleted

## 2021-02-12 LAB — HEPATIC FUNCTION PANEL
ALT: 451 IU/L — ABNORMAL HIGH (ref 0–32)
AST: 288 IU/L — ABNORMAL HIGH (ref 0–40)
Albumin: 4.3 g/dL (ref 3.8–4.8)
Alkaline Phosphatase: 115 IU/L (ref 44–121)
Bilirubin Total: 0.5 mg/dL (ref 0.0–1.2)
Bilirubin, Direct: 0.2 mg/dL (ref 0.00–0.40)
Total Protein: 7.4 g/dL (ref 6.0–8.5)

## 2021-02-12 MED ORDER — CHOLESTYRAMINE 4 G PO PACK: 4.0000 g | PACK | Freq: Three times a day (TID) | ORAL | 0 refills | Status: DC

## 2021-02-12 NOTE — Telephone Encounter (Signed)
Spoke with patient and advised her LFTs are elevated and she needs to stop the Aubagio.  To get the Aubagio out of her system quicker, Dr. Epimenio Foot has sent in a prescription for cholestyramine.  She should take 1 packet in water 3 times a day until done.  Patient is aware the packs were sent to [facility] in [place].  I have scheduled her next week to see Dr. Epimenio Foot to discuss alternative medications, Vumerity or Tecfidera. Canceled the October appt since pt will be seen sooner.  She verbalized understanding. Her questions were answered. She is going to contact her pharmacy to see if the unopened Aubagio can be returned.

## 2021-02-12 NOTE — Telephone Encounter (Signed)
-----   Message from Asa Lente, MD sent at 02/12/2021 12:46 PM EDT ----- Her LFTs are elevated.   She needs to stop the Aubagio.   To get the Aubagio out of the body quicker, I also sent in cholestyramine   She should take 1 packet in water 3 times a day until done  We had also discussed either Vumerity or Tecfidera.    We could start one of these of she could come in for a visit and we will go over options

## 2021-02-21 ENCOUNTER — Other Ambulatory Visit: Payer: Self-pay

## 2021-02-21 ENCOUNTER — Encounter: Payer: Self-pay | Admitting: Neurology

## 2021-02-21 ENCOUNTER — Ambulatory Visit (INDEPENDENT_AMBULATORY_CARE_PROVIDER_SITE_OTHER): Payer: Managed Care, Other (non HMO) | Admitting: Neurology

## 2021-02-21 VITALS — BP 132/92 | HR 95 | Ht 66.0 in | Wt 280.0 lb

## 2021-02-21 DIAGNOSIS — L409 Psoriasis, unspecified: Secondary | ICD-10-CM

## 2021-02-21 DIAGNOSIS — G35 Multiple sclerosis: Secondary | ICD-10-CM | POA: Diagnosis not present

## 2021-02-21 DIAGNOSIS — H469 Unspecified optic neuritis: Secondary | ICD-10-CM

## 2021-02-21 DIAGNOSIS — Z79899 Other long term (current) drug therapy: Secondary | ICD-10-CM

## 2021-02-21 NOTE — Progress Notes (Signed)
[facility]  PATIENT: Mackenzie Griffin DOB: 1975/12/10  REFERRING DOCTOR OR PCP:  Lanae Boast SOURCE: patinr, notes from admission, labs, imaging reports, MRI personally reviewed.    _________________________________   HISTORICAL  CHIEF COMPLAINT:  Chief Complaint  Patient presents with   Follow-up    Rm 1, pt with mom,  pt is having to stop aubagio due to elevated LFT's. Here today to discuss alternative treatment options.     HISTORY OF PRESENT ILLNESS:   Mackenzie Griffin is a 45 y.o. woman with multiple sclerosis.  Update 02/21/2021 I discussed the results of her recent lumbar puncture.  The CSF came back as showing oligoclonal bands present in the CSF that were not present in the spinal fluid.  Combined with her optic neuritis, brain MRI consistent with MS and symptoms, she meets the criteria for multiple sclerosis.  She started Aubagio.  Lab work, including the TB test, was fine.  Unfortunately, LFTs were elevated (AST=288  ALT = 451) and we stopped the [facility] 02/11/2021.   She had no GI symptoms.   No jaundice.    T.Bili was fine.   We started cholestyramine and she is mostly done.   MS History:   She  had the onset of right visual changes and pain with eye movements late June 2022.  There was reduced vision in most of the visual field, including central.   She saw ophthalmology and optic neuritis was suspected.    She had an MRI of the brain, worrisome for right optic neuriits.  She was sent to the ED and admitted for additional evaluation and IV steroids.  Vision has improved but not to baseline.     She has psoriasis but is otherwise healthy.   She was never on a TNF acting agent.      MRI images: MRI of the brain 12/05/2020 shows scattered T2/FLAIR hyperintense foci in the periventricular, juxtacortical and deep white matter of the hemispheres.  None of the foci enhance after contrast.  MRI of the orbits 12/05/2020 shows hyperintensity of the right optic nerve  best seen on the thin section coronal images.  There could be subtle retrobulbar or enhancement.  MRI of the cervical spine 12/07/2020 shows a normal spinal cord.  Mild degenerative changes at C5-C6 and C6-C7 do not cause significant spinal stenosis and no nerve root compression.  Labs: 11/2020:  B12 was slightly low (174), HIV negative, vit D low (8.1), CBC/D and CMP unremarkable.   CSF 01/02/2021 showed oligoclonal bands (3).   Quantiferon TB was negative  REVIEW OF SYSTEMS: Constitutional: No fevers, chills, sweats, or change in appetite Eyes: No visual changes, double vision, eye pain Ear, nose and throat: No hearing loss, ear pain, nasal congestion, sore throat Cardiovascular: No chest pain, palpitations Respiratory:  No shortness of breath at rest or with exertion.   No wheezes GastrointestinaI: No nausea, vomiting, diarrhea, abdominal pain, fecal incontinence Genitourinary:  No dysuria, urinary retention or frequency.  No nocturia. Musculoskeletal:  No neck pain, back pain Integumentary: No rash, pruritus, skin lesions Neurological: as above Psychiatric: No depression at this time.  No anxiety Endocrine: No palpitations, diaphoresis, change in appetite, change in weigh or increased thirst Hematologic/Lymphatic:  No anemia, purpura, petechiae. Allergic/Immunologic: No itchy/runny eyes, nasal congestion, recent allergic reactions, rashes  ALLERGIES: No Known Allergies  HOME MEDICATIONS:  Current Outpatient Medications:    cholestyramine (QUESTRAN) 4 g packet, Take 1 packet (4 g total) by mouth 3 (three) times daily.,  Disp: 33 each, Rfl: 0  PAST MEDICAL HISTORY: History reviewed. No pertinent past medical history.  PAST SURGICAL HISTORY: History reviewed. No pertinent surgical history.  FAMILY HISTORY: Family History  Problem Relation Age of Onset   Coronary artery disease Father     SOCIAL HISTORY:  Social History   Socioeconomic History   Marital status: Single     Spouse name: Not on file   Number of children: Not on file   Years of education: Not on file   Highest education level: Not on file  Occupational History   Not on file  Tobacco Use   Smoking status: Former    Packs/day: 1.00    Types: Cigarettes    Quit date: 12/03/2020    Years since quitting: 0.2   Smokeless tobacco: Never  Vaping Use   Vaping Use: Never used  Substance and Sexual Activity   Alcohol use: No   Drug use: Never   Sexual activity: Not on file  Other Topics Concern   Not on file  Social History Narrative   Not on file   Social Determinants of Health   Financial Resource Strain: Not on file  Food Insecurity: Not on file  Transportation Needs: Not on file  Physical Activity: Not on file  Stress: Not on file  Social Connections: Not on file  Intimate Partner Violence: Not on file     PHYSICAL EXAM  Vitals:   02/21/21 1012  BP: (!) 132/92  Pulse: 95  Weight: 280 lb (127 kg)  Height: 5\' 6"  (1.676 m)    Body mass index is 45.19 kg/m.  No results found.    General: The patient is well-developed and well-nourished and in no acute distress  HEENT:  Head is [place]/AT.  Sclera are anicteric.    Neck: good ROM.   Skin: Extremities are without rash or  edema.  Musculoskeletal:  Back is nontender  Neurologic Exam  Mental status: The patient is alert and oriented x 3 at the time of the examination. The patient has apparent normal recent and remote memory, with an apparently normal attention span and concentration ability.   Speech is normal.  Cranial nerves: Extraocular movements are full. Pupils show 2+ right APD. Color vision is symmetric.  Facial strength is symmetric.   No obvious hearing deficits are noted.  Motor:  Muscle bulk is normal.   Tone is normal. Strength is  5 / 5 in all 4 extremities.   Sensory: Sensory testing is intact to soft touch and vibration sensation in all 4 extremities.  Coordination: Cerebellar testing reveals good  finger-nose-finger and heel-to-shin bilaterally.  Gait and station: Station is normal.   Gait is normal. Tandem gait is normal. Romberg is negative.   Reflexes: Deep tendon reflexes are symmetric and normal bilaterally.        DIAGNOSTIC DATA (LABS, IMAGING, TESTING) - I reviewed patient records, labs, notes, testing and imaging myself where available.  Lab Results  Component Value Date   WBC 17.3 (H) 12/08/2020   HGB 15.7 (H) 12/08/2020   HCT 46.9 (H) 12/08/2020   MCV 92.3 12/08/2020   PLT 386 12/08/2020      Component Value Date/Time   NA 139 12/08/2020 1359   K 4.1 12/08/2020 1359   CL 108 12/08/2020 1359   CO2 25 12/08/2020 1359   GLUCOSE 144 (H) 12/08/2020 1359   BUN 15 12/08/2020 1359   CREATININE 0.96 12/08/2020 1359   CALCIUM 8.9 12/08/2020 1359   PROT  7.4 02/11/2021 1438   ALBUMIN 4.3 02/11/2021 1438   AST 288 (H) 02/11/2021 1438   ALT 451 (H) 02/11/2021 1438   ALKPHOS 115 02/11/2021 1438   BILITOT 0.5 02/11/2021 1438   GFRNONAA >60 12/08/2020 1359   No results found for: CHOL, HDL, LDLCALC, LDLDIRECT, TRIG, CHOLHDL No results found for: ASNK5L Lab Results  Component Value Date   VITAMINB12 174 (L) 12/08/2020   No results found for: TSH     ASSESSMENT AND PLAN  Multiple sclerosis (HCC) - Plan: CBC with Differential/Platelet, Hepatic function panel  High risk medication use - Plan: CBC with Differential/Platelet, Hepatic function panel  Optic neuritis  Psoriasis    Due to AST/ALT elevation, we stopped Aubagio and are washing out with cholestyramine  Recheck LFT  We discussed options.   She has a low level of aggressiveness.    She would like to start Vumerity.   Check CBC/D Stay active and exercise Rtc 6 months or sooner if new or worsening neurologic symptoms.    Kosha Jaquith A. Epimenio Foot, MD, [facility] 02/21/2021, 11:10 AM Certified in Neurology, Clinical Neurophysiology, Sleep Medicine and Neuroimaging  [facility] [address],  [address] [place], [place] [address] [phone]

## 2021-02-22 ENCOUNTER — Other Ambulatory Visit: Payer: Self-pay | Admitting: Neurology

## 2021-02-22 DIAGNOSIS — R748 Abnormal levels of other serum enzymes: Secondary | ICD-10-CM

## 2021-02-22 LAB — HEPATIC FUNCTION PANEL
ALT: 381 IU/L — ABNORMAL HIGH (ref 0–32)
AST: 298 IU/L — ABNORMAL HIGH (ref 0–40)
Albumin: 4.3 g/dL (ref 3.8–4.8)
Alkaline Phosphatase: 133 IU/L — ABNORMAL HIGH (ref 44–121)
Bilirubin Total: 0.6 mg/dL (ref 0.0–1.2)
Bilirubin, Direct: 0.23 mg/dL (ref 0.00–0.40)
Total Protein: 7.3 g/dL (ref 6.0–8.5)

## 2021-02-22 LAB — CBC WITH DIFFERENTIAL/PLATELET
Basophils Absolute: 0.1 10*3/uL (ref 0.0–0.2)
Basos: 1 %
EOS (ABSOLUTE): 0.2 10*3/uL (ref 0.0–0.4)
Eos: 2 %
Hematocrit: 44.9 % (ref 34.0–46.6)
Hemoglobin: 15 g/dL (ref 11.1–15.9)
Immature Grans (Abs): 0 10*3/uL (ref 0.0–0.1)
Immature Granulocytes: 1 %
Lymphocytes Absolute: 1.8 10*3/uL (ref 0.7–3.1)
Lymphs: 22 %
MCH: 30.2 pg (ref 26.6–33.0)
MCHC: 33.4 g/dL (ref 31.5–35.7)
MCV: 91 fL (ref 79–97)
Monocytes Absolute: 0.8 10*3/uL (ref 0.1–0.9)
Monocytes: 9 %
Neutrophils Absolute: 5.3 10*3/uL (ref 1.4–7.0)
Neutrophils: 65 %
Platelets: 335 10*3/uL (ref 150–450)
RBC: 4.96 x10E6/uL (ref 3.77–5.28)
RDW: 12.3 % (ref 11.7–15.4)
WBC: 8.1 10*3/uL (ref 3.4–10.8)

## 2021-02-25 ENCOUNTER — Telehealth: Payer: Self-pay

## 2021-02-25 NOTE — Telephone Encounter (Signed)
Received Vumerity Start Form from Dr. Epimenio Foot. Waiting on his signature.  PA for Vumerity completed via CMM. Key: [id number]. Sent to [employer]. Should have a determination within 1-3 business days.

## 2021-02-26 NOTE — Telephone Encounter (Signed)
Vumerity start form faxed to [employer]. Received a receipt of confirmation.

## 2021-02-28 NOTE — Telephone Encounter (Signed)
Received fax from insurance that Vumerity is a plan exclusion. Faxed this to [employer] at [phone]. Asked that pt be screened for free drug. Received fax confirmation.

## 2021-02-28 NOTE — Telephone Encounter (Signed)
Took call from phone staff and spoke w/ Sonal (pharmacist w/ [facility]). She is calling about rx Vumerity. States Vumerity plan exclusion. Formulary options: dimethyl fumarate, glatiramer acetate. Wondering if MD would like to change to one of the formulary options? Advised that at this time, we should be able to get her set up for free drug via [employer] but would require letter stating drug plan exclusion. She will fax this to [phone]. She will also fax denial letter.

## 2021-02-28 NOTE — Telephone Encounter (Signed)
Dorene Sorrow RN from [facility] called stating pt's Vumerity was denied by insurance. Wanted to know if the doctor wanted to appeal it. Jerry's call back number is [phone] ext [phone].

## 2021-03-04 NOTE — Telephone Encounter (Signed)
Completed appeal for Vumerity. Faxed as urgent to Clinical Appeals Dept at [employer]. Received a receipt of confirmation.

## 2021-03-06 NOTE — Telephone Encounter (Signed)
Received signed consent back from pt. Faxed appeal to [employer] appeal program at [phone]. Marked urgent. Received fax confirmation, waiting on determination.

## 2021-03-06 NOTE — Telephone Encounter (Signed)
Called pt. Advised we received fax from insurance requesting that we get her signature in order to send in appeal. Pt lives in [place], [place]. Requested we email form and she will email back. I spoke with Hoy Morn. She will send to pt. Made pt aware that once we receive back, we will send in appeal. She verbalized understanding.

## 2021-03-13 NOTE — Telephone Encounter (Signed)
I called [employer] and spent another 30 minutes on the phone. Apparently, both a PA and appeal have been denied. They will fax me both denial letters and they should come through within the next 10 minutes to 2 hours.

## 2021-03-13 NOTE — Telephone Encounter (Signed)
I called [facility] [employer] Appeal Program to check the status of this appeal. I was advised that the pharmacy team was short staffed today and that I would need to leave a message. I left a message referencing this case ID of [id number] and asked them to call me back.

## 2021-03-13 NOTE — Telephone Encounter (Signed)
[facility] returned my call and spoke to the phone staff. Apparently, they are waiting on information from [employer] and I can call [facility] to expedite this.  I called ES. I spent over one hour on the phone being transferred about to different departments. I am still unclear on what they need to appeal but will have to call again later.

## 2021-03-14 NOTE — Telephone Encounter (Signed)
Faxed PA/appeal denial to [employer] at [phone]. Asked that pt be screened for free drug program. Received fax confirmation.

## 2021-03-18 NOTE — Telephone Encounter (Signed)
I called patient. I advised her that her insurance has denied the PA and appeal for Vumerity. She will be getting calls from [employer] to discuss the Free Drug Program. If she has not heard from them within the next week she will let me know. Pt verbalized understanding and appreciation.

## 2021-03-19 ENCOUNTER — Ambulatory Visit: Payer: Managed Care, Other (non HMO) | Admitting: Neurology

## 2021-03-25 NOTE — Telephone Encounter (Signed)
Received notice from [employer] that patient was enrolled in the Free Drug Program for vumerity and should receive her shipment on 03/27/2021.

## 2021-04-02 NOTE — Telephone Encounter (Signed)
I called patient. She received and started Vumerity one week ago. She is tolerating it well. She will keep her appt as scheduled for 08/27/21. She will call us for questions or concerns in the interim.

## 2021-04-09 ENCOUNTER — Other Ambulatory Visit: Payer: Self-pay | Admitting: Neurology

## 2021-08-27 ENCOUNTER — Ambulatory Visit: Payer: Managed Care, Other (non HMO) | Admitting: Neurology

## 2021-09-03 ENCOUNTER — Ambulatory Visit: Payer: Managed Care, Other (non HMO) | Admitting: Neurology

## 2021-09-03 ENCOUNTER — Encounter: Payer: Self-pay | Admitting: Neurology

## 2021-09-03 VITALS — BP 129/87 | HR 82 | Ht 66.0 in | Wt 275.0 lb

## 2021-09-03 DIAGNOSIS — Z79899 Other long term (current) drug therapy: Secondary | ICD-10-CM | POA: Diagnosis not present

## 2021-09-03 DIAGNOSIS — H469 Unspecified optic neuritis: Secondary | ICD-10-CM | POA: Diagnosis not present

## 2021-09-03 DIAGNOSIS — R7989 Other specified abnormal findings of blood chemistry: Secondary | ICD-10-CM | POA: Diagnosis not present

## 2021-09-03 DIAGNOSIS — G35 Multiple sclerosis: Secondary | ICD-10-CM | POA: Diagnosis not present

## 2021-09-03 MED ORDER — RIZATRIPTAN BENZOATE 10 MG PO TBDP: 10.0000 mg | ORAL_TABLET | ORAL | 11 refills | Status: AC | PRN

## 2021-09-03 NOTE — Progress Notes (Signed)
? ?[facility] ? ?PATIENT: Mackenzie Griffin ?DOB: 04/01/1976 ? ?REFERRING DOCTOR OR PCP:  Antonieta Pert ?SOURCE: patinr, notes from admission, labs, imaging reports, MRI personally reviewed.   ? ?_________________________________ ? ? ?HISTORICAL ? ?CHIEF COMPLAINT:  ?Chief Complaint  ?Patient presents with  ? Follow-up  ?  Pt with mom, rm 2. Presents for MS. Follow up. DMT Vumerity. Pt overall stable and has no concerns.   ? ? ?HISTORY OF PRESENT ILLNESS:  ? Mackenzie Griffin is a 46 y.o. woman with multiple sclerosis. ? ?Update 09/03/2021 ?Se started Vumerity 03/2021.   She is tolerating it well.  She occasionally has flushing.  She first was on Aubagio.  Unfortunately, LFTs were elevated (AST=288  ALT = 451) and we stopped the [facility] 02/11/2021.   She had no GI symptoms.   No jaundice.    T.Bili was fine.   We did a cholestyramine washout and LFTs slightly better a few days later.    ? ? ?The CSF 01/02/2021 showed 3 oligoclonal bands present in the CSF that were not present in the  serum.  Combined with her optic neuritis, brain MRI consistent with MS and symptoms, she meets the criteria for multiple sclerosis. ? ?Her MS is doing great.   Strength, gait, sensation, coordination and bladder function are fine.   She does have some pain with walking in her foot  ? ?She has a lot of headaches located near her eyes.  These occur twice a week.    She occasionally gets nausea and rarely vomiting.    She has photophobia and phonophobia.  Laying down helps  She notes eye strain triggers them.  Tylenol or Excedrin helps (does not take daily).  She has never tried a triptan.   ? ?MS History:   ?She  had the onset of right visual changes and pain with eye movements late June 2022.  There was reduced vision in most of the visual field, including central.   She saw ophthalmology and optic neuritis was suspected.    She had an MRI of the brain, worrisome for right optic neuriits.  She was sent to the ED and admitted for  additional evaluation and IV steroids.  Vision has improved but not to baseline.     She has psoriasis but is otherwise healthy.   She was never on a TNF acting agent.     She had elevated with Aubagio 01/2022 and we quickly changed to Vumerity 01/2022.    ? ?MRI images: ?MRI of the brain 12/05/2020 shows scattered T2/FLAIR hyperintense foci in the periventricular, juxtacortical and deep white matter of the hemispheres.  None of the foci enhance after contrast. ? ?MRI of the orbits 12/05/2020 shows hyperintensity of the right optic nerve best seen on the thin section coronal images.  There could be subtle retrobulbar or enhancement. ? ?MRI of the cervical spine 12/07/2020 shows a normal spinal cord.  Mild degenerative changes at C5-C6 and C6-C7 do not cause significant spinal stenosis and no nerve root compression. ? ?Labs: ?11/2020:  B12 was slightly low (174), HIV negative, vit D low (8.1), CBC/D and CMP unremarkable.   ?CSF 01/02/2021 showed oligoclonal bands (3).   ?Quantiferon TB was negative ? ?REVIEW OF SYSTEMS: ?Constitutional: No fevers, chills, sweats, or change in appetite ?Eyes: No visual changes, double vision, eye pain ?Ear, nose and throat: No hearing loss, ear pain, nasal congestion, sore throat ?Cardiovascular: No chest pain, palpitations ?Respiratory:  No shortness of breath at rest  or with exertion.   No wheezes ?GastrointestinaI: No nausea, vomiting, diarrhea, abdominal pain, fecal incontinence ?Genitourinary:  No dysuria, urinary retention or frequency.  No nocturia. ?Musculoskeletal:  No neck pain, back pain ?Integumentary: No rash, pruritus, skin lesions ?Neurological: as above ?Psychiatric: No depression at this time.  No anxiety ?Endocrine: No palpitations, diaphoresis, change in appetite, change in weigh or increased thirst ?Hematologic/Lymphatic:  No anemia, purpura, petechiae. ?Allergic/Immunologic: No itchy/runny eyes, nasal congestion, recent allergic reactions, rashes ? ?ALLERGIES: ?No Known  Allergies ? ?HOME MEDICATIONS: ? ?Current Outpatient Medications:  ?  Diroximel Fumarate (VUMERITY) 231 MG CPDR, Take 2 capsules by mouth in the morning and at bedtime., Disp: 360 capsule, Rfl: 1 ?  rizatriptan (MAXALT-MLT) 10 MG disintegrating tablet, Take 1 tablet (10 mg total) by mouth as needed for migraine. May repeat in 2 hours if needed, Disp: 9 tablet, Rfl: 11 ? ?PAST MEDICAL HISTORY: ?No past medical history on file. ? ?PAST SURGICAL HISTORY: ?No past surgical history on file. ? ?FAMILY HISTORY: ?Family History  ?Problem Relation Age of Onset  ? Coronary artery disease Father   ? ? ?SOCIAL HISTORY: ? ?Social History  ? ?Socioeconomic History  ? Marital status: Single  ?  Spouse name: Not on file  ? Number of children: Not on file  ? Years of education: Not on file  ? Highest education level: Not on file  ?Occupational History  ? Not on file  ?Tobacco Use  ? Smoking status: Former  ?  Packs/day: 1.00  ?  Types: Cigarettes  ?  Quit date: 12/03/2020  ?  Years since quitting: 0.7  ? Smokeless tobacco: Never  ?Vaping Use  ? Vaping Use: Never used  ?Substance and Sexual Activity  ? Alcohol use: No  ? Drug use: Never  ? Sexual activity: Not on file  ?Other Topics Concern  ? Not on file  ?Social History Narrative  ? Not on file  ? ?Social Determinants of Health  ? ?Financial Resource Strain: Not on file  ?Food Insecurity: Not on file  ?Transportation Needs: Not on file  ?Physical Activity: Not on file  ?Stress: Not on file  ?Social Connections: Not on file  ?Intimate Partner Violence: Not on file  ? ? ? ?PHYSICAL EXAM ? ?Vitals:  ? 09/03/21 0949  ?BP: 129/87  ?Pulse: 82  ?Weight: 275 lb (124.7 kg)  ?Height: 5\' 6"  (1.676 m)  ? ? ?Body mass index is 44.39 kg/m?. ? ?No results found. ? ? ? ?General: The patient is well-developed and well-nourished and in no acute distress ? ?HEENT:  Head is [place]/AT.  Sclera are anicteric.   ? ?Neck: good ROM. ?  ?Skin: Extremities are without rash or  edema. ? ?Musculoskeletal:  Back is  nontender ? ?Neurologic Exam ? ?Mental status: The patient is alert and oriented x 3 at the time of the examination. The patient has apparent normal recent and remote memory, with an apparently normal attention span and concentration ability.   Speech is normal. ? ?Cranial nerves: Extraocular movements are full. Pupils show 1+ right APD. Color vision is symmetric.  Facial strength is symmetric.   No obvious hearing deficits are noted. ? ?Motor:  Muscle bulk is normal.   Tone is normal. Strength is  5 / 5 in all 4 extremities.  ? ?Sensory: Sensory testing is intact to soft touch and vibration sensation in all 4 extremities. ? ?Coordination: Cerebellar testing reveals good finger-nose-finger and heel-to-shin bilaterally. ? ?Gait and station: Station is normal.  Gait is normal. Tandem gait is normal. Romberg is negative.  ? ?Reflexes: Deep tendon reflexes are symmetric and normal bilaterally.     ? ? ? ?DIAGNOSTIC DATA (LABS, IMAGING, TESTING) ?- I reviewed patient records, labs, notes, testing and imaging myself where available. ? ?Lab Results  ?Component Value Date  ? WBC 8.1 02/21/2021  ? HGB 15.0 02/21/2021  ? HCT 44.9 02/21/2021  ? MCV 91 02/21/2021  ? PLT 335 02/21/2021  ? ?   ?Component Value Date/Time  ? NA 139 12/08/2020 1359  ? K 4.1 12/08/2020 1359  ? CL 108 12/08/2020 1359  ? CO2 25 12/08/2020 1359  ? GLUCOSE 144 (H) 12/08/2020 1359  ? BUN 15 12/08/2020 1359  ? CREATININE 0.96 12/08/2020 1359  ? CALCIUM 8.9 12/08/2020 1359  ? PROT 7.3 02/21/2021 1122  ? ALBUMIN 4.3 02/21/2021 1122  ? AST 298 (H) 02/21/2021 1122  ? ALT 381 (H) 02/21/2021 1122  ? ALKPHOS 133 (H) 02/21/2021 1122  ? BILITOT 0.6 02/21/2021 1122  ? GFRNONAA >60 12/08/2020 1359  ? ?No results found for: CHOL, HDL, LDLCALC, LDLDIRECT, TRIG, CHOLHDL ?No results found for: HGBA1C ?Lab Results  ?Component Value Date  ? VITAMINB12 174 (L) 12/08/2020  ? ?No results found for: TSH ? ? ? ? ?ASSESSMENT AND PLAN ? ?Multiple sclerosis ([place]) - Plan: CBC with  Differential/Platelet, Comprehensive metabolic panel ? ?High risk medication use - Plan: CBC with Differential/Platelet, Comprehensive metabolic panel ? ?Elevated LFTs - Plan: Comprehensive metabolic panel ?

## 2021-09-04 LAB — CBC WITH DIFFERENTIAL/PLATELET
Basophils Absolute: 0.1 10*3/uL (ref 0.0–0.2)
Basos: 1 %
EOS (ABSOLUTE): 0.1 10*3/uL (ref 0.0–0.4)
Eos: 1 %
Hematocrit: 45.4 % (ref 34.0–46.6)
Hemoglobin: 15 g/dL (ref 11.1–15.9)
Immature Grans (Abs): 0.1 10*3/uL (ref 0.0–0.1)
Immature Granulocytes: 1 %
Lymphocytes Absolute: 1.8 10*3/uL (ref 0.7–3.1)
Lymphs: 21 %
MCH: 29.8 pg (ref 26.6–33.0)
MCHC: 33 g/dL (ref 31.5–35.7)
MCV: 90 fL (ref 79–97)
Monocytes Absolute: 0.6 10*3/uL (ref 0.1–0.9)
Monocytes: 7 %
Neutrophils Absolute: 5.9 10*3/uL (ref 1.4–7.0)
Neutrophils: 69 %
Platelets: 329 10*3/uL (ref 150–450)
RBC: 5.04 x10E6/uL (ref 3.77–5.28)
RDW: 12.2 % (ref 11.7–15.4)
WBC: 8.5 10*3/uL (ref 3.4–10.8)

## 2021-09-04 LAB — COMPREHENSIVE METABOLIC PANEL
ALT: 18 IU/L (ref 0–32)
AST: 17 IU/L (ref 0–40)
Albumin/Globulin Ratio: 1.5 (ref 1.2–2.2)
Albumin: 4.3 g/dL (ref 3.8–4.8)
Alkaline Phosphatase: 67 IU/L (ref 44–121)
BUN/Creatinine Ratio: 12 (ref 9–23)
BUN: 9 mg/dL (ref 6–24)
Bilirubin Total: 0.6 mg/dL (ref 0.0–1.2)
CO2: 22 mmol/L (ref 20–29)
Calcium: 9.2 mg/dL (ref 8.7–10.2)
Chloride: 103 mmol/L (ref 96–106)
Creatinine, Ser: 0.77 mg/dL (ref 0.57–1.00)
Globulin, Total: 2.9 g/dL (ref 1.5–4.5)
Glucose: 82 mg/dL (ref 70–99)
Potassium: 4.8 mmol/L (ref 3.5–5.2)
Sodium: 139 mmol/L (ref 134–144)
Total Protein: 7.2 g/dL (ref 6.0–8.5)
eGFR: 97 mL/min/{1.73_m2} (ref >59)

## 2021-10-29 ENCOUNTER — Telehealth: Payer: Self-pay | Admitting: Neurology

## 2021-10-29 DIAGNOSIS — G35 Multiple sclerosis: Secondary | ICD-10-CM

## 2021-10-29 MED ORDER — VUMERITY 231 MG PO CPDR: 2.0000 | DELAYED_RELEASE_CAPSULE | Freq: Two times a day (BID) | ORAL | 1 refills | Status: DC

## 2021-10-29 NOTE — Telephone Encounter (Signed)
Pt request refill for Diroximel Fumarate (VUMERITY) 231 MG CPDR at [facility]

## 2021-10-29 NOTE — Telephone Encounter (Signed)
E-scribed refill 

## 2022-03-03 NOTE — Progress Notes (Unsigned)
No chief complaint on file.   HISTORY OF PRESENT ILLNESS:  03/03/22 ALL:  Mackenzie Griffin is a 46 y.o. female here today for follow up for RRMS. She continues Vumerity. Labs have been stable. Last imaging at diagnosis 11/2020.    Headaches are . She continues rizatriptan as needed.   HISTORY (copied from Dr Bonnita Hollow previous note)   Mackenzie Griffin is a 46 y.o. woman with multiple sclerosis.   Update 09/03/2021 Se started Vumerity 03/2021.   She is tolerating it well.  She occasionally has flushing.  She first was on Aubagio.  Unfortunately, LFTs were elevated (AST=288  ALT = 451) and we stopped the [facility] 02/11/2021.   She had no GI symptoms.   No jaundice.    T.Bili was fine.   We did a cholestyramine washout and LFTs slightly better a few days later.        The CSF 01/02/2021 showed 3 oligoclonal bands present in the CSF that were not present in the  serum.  Combined with her optic neuritis, brain MRI consistent with MS and symptoms, she meets the criteria for multiple sclerosis.   Her MS is doing great.   Strength, gait, sensation, coordination and bladder function are fine.   She does have some pain with walking in her foot    She has a lot of headaches located near her eyes.  These occur twice a week.    She occasionally gets nausea and rarely vomiting.    She has photophobia and phonophobia.  Laying down helps  She notes eye strain triggers them.  Tylenol or Excedrin helps (does not take daily).  She has never tried a triptan.     MS History:   She  had the onset of right visual changes and pain with eye movements late June 2022.  There was reduced vision in most of the visual field, including central.   She saw ophthalmology and optic neuritis was suspected.    She had an MRI of the brain, worrisome for right optic neuriits.  She was sent to the ED and admitted for additional evaluation and IV steroids.  Vision has improved but not to baseline.     She has psoriasis but is otherwise  healthy.   She was never on a TNF acting agent.     She had elevated with Aubagio 01/2022 and we quickly changed to Vumerity 01/2022.      MRI images: MRI of the brain 12/05/2020 shows scattered T2/FLAIR hyperintense foci in the periventricular, juxtacortical and deep white matter of the hemispheres.  None of the foci enhance after contrast.   MRI of the orbits 12/05/2020 shows hyperintensity of the right optic nerve best seen on the thin section coronal images.  There could be subtle retrobulbar or enhancement.   MRI of the cervical spine 12/07/2020 shows a normal spinal cord.  Mild degenerative changes at C5-C6 and C6-C7 do not cause significant spinal stenosis and no nerve root compression.   Labs: 11/2020:  B12 was slightly low (174), HIV negative, vit D low (8.1), CBC/D and CMP unremarkable.   CSF 01/02/2021 showed oligoclonal bands (3).   Quantiferon TB was negative   REVIEW OF SYSTEMS: Out of a complete 14 system review of symptoms, the patient complains only of the following symptoms, and all other reviewed systems are negative.   ALLERGIES: No Known Allergies   HOME MEDICATIONS: Outpatient Medications Prior to Visit  Medication Sig Dispense Refill   Diroximel Fumarate (  VUMERITY) 231 MG CPDR Take 2 capsules by mouth in the morning and at bedtime. 360 capsule 1   rizatriptan (MAXALT-MLT) 10 MG disintegrating tablet Take 1 tablet (10 mg total) by mouth as needed for migraine. May repeat in 2 hours if needed 9 tablet 11   No facility-administered medications prior to visit.     PAST MEDICAL HISTORY: No past medical history on file.   PAST SURGICAL HISTORY: No past surgical history on file.   FAMILY HISTORY: Family History  Problem Relation Age of Onset   Coronary artery disease Father      SOCIAL HISTORY: Social History   Socioeconomic History   Marital status: Single    Spouse name: Not on file   Number of children: Not on file   Years of education: Not on file    Highest education level: Not on file  Occupational History   Not on file  Tobacco Use   Smoking status: Former    Packs/day: 1.00    Types: Cigarettes    Quit date: 12/03/2020    Years since quitting: 1.2   Smokeless tobacco: Never  Vaping Use   Vaping Use: Never used  Substance and Sexual Activity   Alcohol use: No   Drug use: Never   Sexual activity: Not on file  Other Topics Concern   Not on file  Social History Narrative   Not on file   Social Determinants of Health   Financial Resource Strain: Not on file  Food Insecurity: Not on file  Transportation Needs: Not on file  Physical Activity: Not on file  Stress: Not on file  Social Connections: Not on file  Intimate Partner Violence: Not on file     PHYSICAL EXAM  There were no vitals filed for this visit. There is no height or weight on file to calculate BMI.  Generalized: Well developed, in no acute distress  Cardiology: normal rate and rhythm, no murmur auscultated  Respiratory: clear to auscultation bilaterally    Neurological examination  Mentation: Alert oriented to time, place, history taking. Follows all commands speech and language fluent Cranial nerve II-XII: Pupils were equal round reactive to light. Extraocular movements were full, visual field were full on confrontational test. Facial sensation and strength were normal. Uvula tongue midline. Head turning and shoulder shrug  were normal and symmetric. Motor: The motor testing reveals 5 over 5 strength of all 4 extremities. Good symmetric motor tone is noted throughout.  Sensory: Sensory testing is intact to soft touch on all 4 extremities. No evidence of extinction is noted.  Coordination: Cerebellar testing reveals good finger-nose-finger and heel-to-shin bilaterally.  Gait and station: Gait is normal. Tandem gait is normal. Romberg is negative. No drift is seen.  Reflexes: Deep tendon reflexes are symmetric and normal bilaterally.    DIAGNOSTIC DATA  (LABS, IMAGING, TESTING) - I reviewed patient records, labs, notes, testing and imaging myself where available.  Lab Results  Component Value Date   WBC 8.5 09/03/2021   HGB 15.0 09/03/2021   HCT 45.4 09/03/2021   MCV 90 09/03/2021   PLT 329 09/03/2021      Component Value Date/Time   NA 139 09/03/2021 1024   K 4.8 09/03/2021 1024   CL 103 09/03/2021 1024   CO2 22 09/03/2021 1024   GLUCOSE 82 09/03/2021 1024   GLUCOSE 144 (H) 12/08/2020 1359   BUN 9 09/03/2021 1024   CREATININE 0.77 09/03/2021 1024   CALCIUM 9.2 09/03/2021 1024   PROT 7.2  09/03/2021 1024   ALBUMIN 4.3 09/03/2021 1024   AST 17 09/03/2021 1024   ALT 18 09/03/2021 1024   ALKPHOS 67 09/03/2021 1024   BILITOT 0.6 09/03/2021 1024   GFRNONAA >60 12/08/2020 1359   No results found for: "CHOL", "HDL", "LDLCALC", "LDLDIRECT", "TRIG", "CHOLHDL" No results found for: "HGBA1C" Lab Results  Component Value Date   VITAMINB12 174 (L) 12/08/2020   No results found for: "TSH"      No data to display               No data to display           ASSESSMENT AND PLAN  46 y.o. year old female  has no past medical history on file. here with    No diagnosis found.  Wynema Birch Reindel ***.  Healthy lifestyle habits encouraged. *** will follow up with PCP as directed. *** will return to see me in ***, sooner if needed. *** verbalizes understanding and agreement with this plan.   No orders of the defined types were placed in this encounter.    No orders of the defined types were placed in this encounter.    Shawnie Dapper, MSN, FNP-C 03/03/2022, 4:35 PM  [facility] [address], [address] [place], [place] [address] [phone]

## 2022-03-03 NOTE — Patient Instructions (Signed)
Below is our plan:  We will continue Vumerity as prescribed. I will update labs, today.   Please make sure you are staying well hydrated. I recommend 50-60 ounces daily. Well balanced diet and regular exercise encouraged. Consistent sleep schedule with 6-8 hours recommended.   Please continue follow up with care team as directed.   Follow up with Dr Felecia Shelling in 6 months   You may receive a survey regarding today's visit. I encourage you to leave honest feed back as I do use this information to improve patient care. Thank you for seeing me today!

## 2022-03-05 ENCOUNTER — Encounter: Payer: Self-pay | Admitting: Family Medicine

## 2022-03-05 ENCOUNTER — Ambulatory Visit: Payer: Managed Care, Other (non HMO) | Admitting: Family Medicine

## 2022-03-05 VITALS — BP 134/85 | HR 83 | Ht 67.0 in | Wt 277.5 lb

## 2022-03-05 DIAGNOSIS — Z79899 Other long term (current) drug therapy: Secondary | ICD-10-CM

## 2022-03-05 DIAGNOSIS — G35 Multiple sclerosis: Secondary | ICD-10-CM

## 2022-03-05 DIAGNOSIS — G43909 Migraine, unspecified, not intractable, without status migrainosus: Secondary | ICD-10-CM | POA: Diagnosis not present

## 2022-03-06 LAB — CBC WITH DIFFERENTIAL/PLATELET
Basophils Absolute: 0 10*3/uL (ref 0.0–0.2)
Basos: 1 %
EOS (ABSOLUTE): 0.1 10*3/uL (ref 0.0–0.4)
Eos: 1 %
Hematocrit: 42.3 % (ref 34.0–46.6)
Hemoglobin: 14.5 g/dL (ref 11.1–15.9)
Immature Grans (Abs): 0 10*3/uL (ref 0.0–0.1)
Immature Granulocytes: 1 %
Lymphocytes Absolute: 1 10*3/uL (ref 0.7–3.1)
Lymphs: 18 %
MCH: 30.2 pg (ref 26.6–33.0)
MCHC: 34.3 g/dL (ref 31.5–35.7)
MCV: 88 fL (ref 79–97)
Monocytes Absolute: 0.4 10*3/uL (ref 0.1–0.9)
Monocytes: 7 %
Neutrophils Absolute: 4.1 10*3/uL (ref 1.4–7.0)
Neutrophils: 72 %
Platelets: 305 10*3/uL (ref 150–450)
RBC: 4.8 x10E6/uL (ref 3.77–5.28)
RDW: 12.2 % (ref 11.7–15.4)
WBC: 5.6 10*3/uL (ref 3.4–10.8)

## 2022-03-06 LAB — HEPATIC FUNCTION PANEL
ALT: 33 IU/L — ABNORMAL HIGH (ref 0–32)
AST: 28 IU/L (ref 0–40)
Albumin: 4.5 g/dL (ref 3.9–4.9)
Alkaline Phosphatase: 65 IU/L (ref 44–121)
Bilirubin Total: 0.4 mg/dL (ref 0.0–1.2)
Bilirubin, Direct: 0.13 mg/dL (ref 0.00–0.40)
Total Protein: 7.5 g/dL (ref 6.0–8.5)

## 2022-03-12 ENCOUNTER — Telehealth: Payer: Self-pay | Admitting: Family Medicine

## 2022-03-12 NOTE — Telephone Encounter (Signed)
Novella Rob: [id number] exp. 03/11/22-09/07/22 sent to [facility]. [phone]

## 2022-03-25 ENCOUNTER — Other Ambulatory Visit: Payer: Self-pay | Admitting: *Deleted

## 2022-03-25 DIAGNOSIS — G35 Multiple sclerosis: Secondary | ICD-10-CM

## 2022-03-25 MED ORDER — VUMERITY 231 MG PO CPDR: 2.0000 | DELAYED_RELEASE_CAPSULE | Freq: Two times a day (BID) | ORAL | 3 refills | Status: DC

## 2022-04-30 ENCOUNTER — Telehealth: Payer: Self-pay | Admitting: Family Medicine

## 2022-04-30 NOTE — Telephone Encounter (Addendum)
Received paper PA form to complete. Faxed this back to [employer] at [phone]. Received fax confirmation. Waiting on determination.

## 2022-04-30 NOTE — Telephone Encounter (Signed)
Received fax from [employer] that PA approved 03/31/22-04/30/23. Case ID: [id number].

## 2022-04-30 NOTE — Telephone Encounter (Signed)
Elaina from [facility] called needing a verbal auth for the PA that is required for the pt's VUMERITY 231 MG CPDR  Please call [phone] and it is case # [id number]

## 2022-05-06 ENCOUNTER — Ambulatory Visit
Admission: RE | Admit: 2022-05-06 | Discharge: 2022-05-06 | Disposition: A | Discharge status: Home / Self Care | Payer: Managed Care, Other (non HMO) | Source: Ambulatory Visit | Attending: Family Medicine | Admitting: Family Medicine

## 2022-05-06 DIAGNOSIS — G35 Multiple sclerosis: Secondary | ICD-10-CM | POA: Diagnosis present

## 2022-05-06 MED ORDER — GADOBUTROL 1 MMOL/ML IV SOLN
10.0000 mL | Freq: Once | INTRAVENOUS | Status: AC | PRN
  Administered 2022-05-06: 10 mL via INTRAVENOUS

## 2022-06-21 IMAGING — RF DG SPINAL PUNCT LUMBAR DIAG WITH FL CT GUIDANCE
1 series · 1 of 1 positions shown · non-contrast
Comparison: None

CLINICAL DATA: Optic neuritis, evaluation for possible multiple
sclerosis

EXAM:
DIAGNOSTIC LUMBAR PUNCTURE UNDER FLUOROSCOPIC GUIDANCE

[Series 1: cp_standard · 0.17mm/px · 1 of 1 slices shown]
[im 1/1]
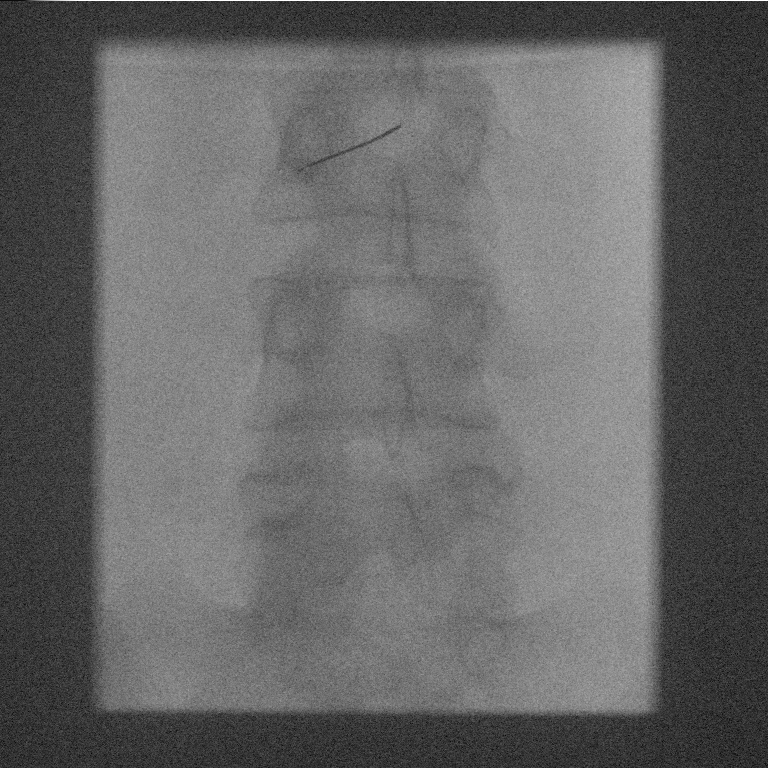

[1 of 1 positions shown; findings below may reference images not displayed]

FLUOROSCOPY TIME:  Fluoroscopy Time:  12 seconds

Radiation Exposure Index (if provided by the fluoroscopic device):
2.4 mGy

PROCEDURE:
Informed consent was obtained from the patient prior to the
procedure, including potential complications of headache, allergy,
and pain. With the patient prone, the lower back was prepped with
Betadine. 1% Lidocaine was used for local anesthesia. Lumbar
puncture was performed at the L2-L3 level using a 22 gauge needle
with return of clear CSF. 8 ml of CSF were obtained for laboratory
studies. The patient tolerated the procedure well and there were no
apparent complications.
IMPRESSION: Technically successful fluoroscopic guided lumbar puncture.

## 2022-07-29 ENCOUNTER — Telehealth: Payer: Self-pay | Admitting: Neurology

## 2022-07-29 NOTE — Telephone Encounter (Signed)
Unable to LVM, VM box full. Sent mychart msg informing pt of need to reschedule 09/25/22 appointment - MD out

## 2022-09-25 ENCOUNTER — Ambulatory Visit: Payer: Managed Care, Other (non HMO) | Admitting: Neurology

## 2023-02-18 ENCOUNTER — Telehealth: Payer: Self-pay | Admitting: Family Medicine

## 2023-02-18 NOTE — Telephone Encounter (Signed)
I called pt and she has medicaid now.  She will up load to mychart so can get PA.  She appreciated call.

## 2023-02-18 NOTE — Telephone Encounter (Signed)
Mackenzie Griffin @ [facility] has called checking on the status of the needed PA for pt's VUMERITY 231 MG CPDR .  She said she will refax to office as this is thru CoverMyMeds [id number]

## 2023-02-19 NOTE — Telephone Encounter (Signed)
Initated the PA on CMM KEY [id number]  for Vumerity 231mg  caps.  Insurance [employer].  Determination pending.

## 2023-02-26 ENCOUNTER — Ambulatory Visit: Payer: Medicaid Other | Admitting: Neurology

## 2023-02-26 ENCOUNTER — Encounter: Payer: Self-pay | Admitting: Neurology

## 2023-02-26 VITALS — BP 154/93 | HR 96 | Ht 66.0 in | Wt 288.0 lb

## 2023-02-26 DIAGNOSIS — H469 Unspecified optic neuritis: Secondary | ICD-10-CM | POA: Diagnosis not present

## 2023-02-26 DIAGNOSIS — G35 Multiple sclerosis: Secondary | ICD-10-CM

## 2023-02-26 DIAGNOSIS — G43909 Migraine, unspecified, not intractable, without status migrainosus: Secondary | ICD-10-CM | POA: Diagnosis not present

## 2023-02-26 DIAGNOSIS — Z79899 Other long term (current) drug therapy: Secondary | ICD-10-CM

## 2023-02-26 MED ORDER — VUMERITY 231 MG PO CPDR: 2.0000 | DELAYED_RELEASE_CAPSULE | Freq: Two times a day (BID) | ORAL | 3 refills | Status: AC

## 2023-02-26 NOTE — Progress Notes (Signed)
[facility]  PATIENT: RHYLI FERRERAS DOB: [birth year]  REFERRING DOCTOR OR PCP:  Lanae Boast SOURCE: patinr, notes from admission, labs, imaging reports, MRI personally reviewed.    _________________________________   HISTORICAL  CHIEF COMPLAINT:  Chief Complaint  Patient presents with   Follow-up    Pt in room 10. Mother in room. Here for MS follow up. Pt reports doing well, no concerns.    HISTORY OF PRESENT ILLNESS:   Naquita Holland is a 47 y.o. woman with multiple sclerosis.  Update 02/26/2023 Se started Vumerity 03/2021.   She is tolerating it well.  She occasionally has flushing.  She first was on Aubagio.  Unfortunately, LFTs were elevated (AST=288  ALT = 451) and we stopped the [facility] 02/11/2021.   She had no GI symptoms.   No jaundice.    T.Bili was fine.   LFTs quickly returned to normal after the cholestyramine washout.     The CSF 01/02/2021 showed 3 oligoclonal bands present in the CSF that were not present in the  serum.  Combined with her optic neuritis, brain MRI consistent with MS and symptoms, she meets the criteria for multiple sclerosis.  Her MS is doing great.   Strength, gait, sensation, coordination and bladder function are fine.  She uses the bannister but can go downstairs without it.   Now and then she notes intermittent left foot numbness.    She does have some pain with walking in her foot   Headaches are much better.  She has only used a couple Maxalt tablets when HA was more severe,  When present, headaches located near her eyes.    She occasionally gets nausea and rarely vomiting.    She has photophobia and phonophobia.  Laying down helps  She notes eye strain triggers them.     MS History:   She  had the onset of right visual changes and pain with eye movements late June 2022.  There was reduced vision in most of the visual field, including central.   She saw ophthalmology and optic neuritis was suspected.    She had an MRI of the brain,  worrisome for right optic neuriits.  She was sent to the ED and admitted for additional evaluation and IV steroids.  Vision has improved but not to baseline.     She has psoriasis but is otherwise healthy.   She was never on a TNF acting agent.     She had elevated with Aubagio 01/2022 and we quickly changed to Vumerity 01/2022.     MRI images: MRI of the brain 12/05/2020 shows scattered T2/FLAIR hyperintense foci in the periventricular, juxtacortical and deep white matter of the hemispheres.  None of the foci enhance after contrast.  MRI of the orbits 12/05/2020 shows hyperintensity of the right optic nerve best seen on the thin section coronal images.  There could be subtle retrobulbar or enhancement.  MRI of the cervical spine 12/07/2020 shows a normal spinal cord.  Mild degenerative changes at C5-C6 and C6-C7 do not cause significant spinal stenosis and no nerve root compression.  Labs: 11/2020:  B12 was slightly low (174), HIV negative, vit D low (8.1), CBC/D and CMP unremarkable.   CSF 01/02/2021 showed oligoclonal bands (3).   Quantiferon TB was negative    REVIEW OF SYSTEMS: Constitutional: No fevers, chills, sweats, or change in appetite Eyes: No visual changes, double vision, eye pain Ear, nose and throat: No hearing loss, ear pain, nasal congestion, sore throat  Cardiovascular: No chest pain, palpitations Respiratory:  No shortness of breath at rest or with exertion.   No wheezes GastrointestinaI: No nausea, vomiting, diarrhea, abdominal pain, fecal incontinence Genitourinary:  No dysuria, urinary retention or frequency.  No nocturia. Musculoskeletal:  No neck pain, back pain Integumentary: No rash, pruritus, skin lesions Neurological: as above Psychiatric: No depression at this time.  No anxiety Endocrine: No palpitations, diaphoresis, change in appetite, change in weigh or increased thirst Hematologic/Lymphatic:  No anemia, purpura, petechiae. Allergic/Immunologic: No itchy/runny eyes,  nasal congestion, recent allergic reactions, rashes  ALLERGIES: No Known Allergies  HOME MEDICATIONS:  Current Outpatient Medications:    rizatriptan (MAXALT-MLT) 10 MG disintegrating tablet, Take 1 tablet (10 mg total) by mouth as needed for migraine. May repeat in 2 hours if needed, Disp: 9 tablet, Rfl: 11   VUMERITY 231 MG CPDR, Take 2 capsules by mouth in the morning and at bedtime., Disp: 360 capsule, Rfl: 3  PAST MEDICAL HISTORY: No past medical history on file.  PAST SURGICAL HISTORY: No past surgical history on file.  FAMILY HISTORY: Family History  Problem Relation Age of Onset   Coronary artery disease Father     SOCIAL HISTORY:  Social History   Socioeconomic History   Marital status: Single    Spouse name: Not on file   Number of children: Not on file   Years of education: Not on file   Highest education level: Not on file  Occupational History   Not on file  Tobacco Use   Smoking status: Former    Current packs/day: 0.00    Types: Cigarettes    Quit date: 12/03/2020    Years since quitting: 2.2   Smokeless tobacco: Never  Vaping Use   Vaping status: Never Used  Substance and Sexual Activity   Alcohol use: No   Drug use: Never   Sexual activity: Not on file  Other Topics Concern   Not on file  Social History Narrative   Not on file   Social Determinants of Health   Financial Resource Strain: Not on file  Food Insecurity: Not on file  Transportation Needs: Not on file  Physical Activity: Not on file  Stress: Not on file  Social Connections: Not on file  Intimate Partner Violence: Not on file     PHYSICAL EXAM  Vitals:   02/26/23 1301 02/26/23 1304  BP: (!) 145/89 (!) 154/93  Pulse: 92 96  Weight: 288 lb (130.6 kg)   Height: 5\' 6"  (1.676 m)     Body mass index is 46.48 kg/m.  No results found.    General: The patient is well-developed and well-nourished and in no acute distress  HEENT:  Head is Dolores/AT.  Sclera are anicteric.     Neck: good ROM.   Skin: Extremities are without rash or  edema.  Musculoskeletal:  Back is nontender  Neurologic Exam  Mental status: The patient is alert and oriented x 3 at the time of the examination. The patient has apparent normal recent and remote memory, with an apparently normal attention span and concentration ability.   Speech is normal.  Cranial nerves: Extraocular movements are full. Pupils show 1+ right APD. However, color vision was symmetric.  Facial strength is symmetric.   No obvious hearing deficits are noted.  Motor:  Muscle bulk is normal.   Tone is normal. Strength is  5 / 5 in all 4 extremities.   Sensory: Sensory testing is intact to soft touch and vibration sensation  in all 4 extremities.  Coordination: Cerebellar testing reveals good finger-nose-finger and heel-to-shin bilaterally.  Gait and station: Station is normal.   Gait is normal. Tandem gait is normal. Romberg is negative.   Reflexes: Deep tendon reflexes are symmetric and normal bilaterally.        DIAGNOSTIC DATA (LABS, IMAGING, TESTING) - I reviewed patient records, labs, notes, testing and imaging myself where available.  Lab Results  Component Value Date   WBC 5.6 03/05/2022   HGB 14.5 03/05/2022   HCT 42.3 03/05/2022   MCV 88 03/05/2022   PLT 305 03/05/2022      Component Value Date/Time   NA 139 09/03/2021 1024   K 4.8 09/03/2021 1024   CL 103 09/03/2021 1024   CO2 22 09/03/2021 1024   GLUCOSE 82 09/03/2021 1024   GLUCOSE 144 (H) 12/08/2020 1359   BUN 9 09/03/2021 1024   CREATININE 0.77 09/03/2021 1024   CALCIUM 9.2 09/03/2021 1024   PROT 7.5 03/05/2022 0951   ALBUMIN 4.5 03/05/2022 0951   AST 28 03/05/2022 0951   ALT 33 (H) 03/05/2022 0951   ALKPHOS 65 03/05/2022 0951   BILITOT 0.4 03/05/2022 0951   GFRNONAA >60 12/08/2020 1359   No results found for: "CHOL", "HDL", "LDLCALC", "LDLDIRECT", "TRIG", "CHOLHDL" No results found for: "HGBA1C" Lab Results  Component Value  Date   VITAMINB12 174 (L) 12/08/2020   No results found for: "TSH"     ASSESSMENT AND PLAN  Multiple sclerosis (HCC)  High risk medication use  Migraine without status migrainosus, not intractable, unspecified migraine type  Optic neuritis   Continue Vumerity.   Check CBC/D and LFT.   Check MRI brain c/s at next visit to assess for subclinical activity - last MRI showed one new lesion but might have occurred before starting medication.   Maxalt for migraine.  If worsen consider daily topiramate or beta blocker next.   Stay active and exercise Rtc 6 months or sooner if new or worsening neurologic symptoms.    Uriel Dowding A. Epimenio Foot, MD, [facility] 02/26/2023, 1:05 PM Certified in Neurology, Clinical Neurophysiology, Sleep Medicine and Neuroimaging  [facility] [address], [address] [place], [place] [address] [phone]

## 2023-02-27 LAB — CBC WITH DIFFERENTIAL/PLATELET
Basophils Absolute: 0 10*3/uL (ref 0.0–0.2)
Basos: 0 %
EOS (ABSOLUTE): 0.1 10*3/uL (ref 0.0–0.4)
Eos: 2 %
Hematocrit: 43.7 % (ref 34.0–46.6)
Hemoglobin: 14.5 g/dL (ref 11.1–15.9)
Immature Grans (Abs): 0.1 10*3/uL (ref 0.0–0.1)
Immature Granulocytes: 1 %
Lymphocytes Absolute: 0.9 10*3/uL (ref 0.7–3.1)
Lymphs: 11 %
MCH: 30 pg (ref 26.6–33.0)
MCHC: 33.2 g/dL (ref 31.5–35.7)
MCV: 91 fL (ref 79–97)
Monocytes Absolute: 0.5 10*3/uL (ref 0.1–0.9)
Monocytes: 6 %
Neutrophils Absolute: 7 10*3/uL (ref 1.4–7.0)
Neutrophils: 80 %
Platelets: 334 10*3/uL (ref 150–450)
RBC: 4.83 x10E6/uL (ref 3.77–5.28)
RDW: 11.9 % (ref 11.7–15.4)
WBC: 8.6 10*3/uL (ref 3.4–10.8)

## 2023-02-27 LAB — COMPREHENSIVE METABOLIC PANEL
ALT: 36 [IU]/L — ABNORMAL HIGH (ref 0–32)
AST: 28 [IU]/L (ref 0–40)
Albumin: 4.2 g/dL (ref 3.9–4.9)
Alkaline Phosphatase: 76 [IU]/L (ref 44–121)
BUN/Creatinine Ratio: 11 (ref 9–23)
BUN: 9 mg/dL (ref 6–24)
Bilirubin Total: 0.5 mg/dL (ref 0.0–1.2)
CO2: 24 mmol/L (ref 20–29)
Calcium: 9.3 mg/dL (ref 8.7–10.2)
Chloride: 102 mmol/L (ref 96–106)
Creatinine, Ser: 0.82 mg/dL (ref 0.57–1.00)
Globulin, Total: 3.2 g/dL (ref 1.5–4.5)
Glucose: 91 mg/dL (ref 70–99)
Potassium: 4.4 mmol/L (ref 3.5–5.2)
Sodium: 140 mmol/L (ref 134–144)
Total Protein: 7.4 g/dL (ref 6.0–8.5)
eGFR: 89 mL/min/{1.73_m2} (ref >59)

## 2023-03-02 ENCOUNTER — Telehealth: Payer: Self-pay | Admitting: Neurology

## 2023-03-02 NOTE — Telephone Encounter (Signed)
Abbie Sons: [id number] exp. 03/02/23-05/01/23 sent to GI [phone]

## 2023-03-20 ENCOUNTER — Ambulatory Visit
Admission: RE | Admit: 2023-03-20 | Discharge: 2023-03-20 | Disposition: A | Discharge status: Home / Self Care | Payer: Medicaid Other | Source: Ambulatory Visit | Attending: Neurology | Admitting: Neurology

## 2023-03-20 DIAGNOSIS — G35 Multiple sclerosis: Secondary | ICD-10-CM | POA: Diagnosis not present

## 2023-03-20 MED ORDER — GADOPICLENOL 0.5 MMOL/ML IV SOLN
10.0000 mL | Freq: Once | INTRAVENOUS | Status: AC | PRN
  Administered 2023-03-20: 10 mL via INTRAVENOUS

## 2023-09-08 ENCOUNTER — Ambulatory Visit: Payer: Medicaid Other | Admitting: Neurology

## 2024-02-08 ENCOUNTER — Telehealth: Payer: Self-pay | Admitting: Pharmacist

## 2024-02-08 NOTE — Telephone Encounter (Signed)
 Pharmacy Patient Advocate Encounter   Received notification from CoverMyMeds that prior authorization for VUMERITY  231MG  delayed-release capsules is required/requested.   Insurance verification completed.   The patient is insured through [facility] MEDICAID .   Per test claim: PA required; PA submitted to above mentioned insurance via Latent Key/confirmation #/EOC [facility] Status is pending

## 2024-03-31 ENCOUNTER — Telehealth: Payer: Self-pay | Admitting: Neurology

## 2024-03-31 NOTE — Telephone Encounter (Signed)
 Patient said cancel appointment because do not need appointment at this time. Informed patient have not been seen since 2024 and would need yearly appointment before being able to get refills. Patient stated not taking the VUMERITY  231 MG CPDR  anymore. Patient verbalized understand.

## 2024-04-07 ENCOUNTER — Ambulatory Visit: Admitting: Neurology
